# Patient Record
Sex: Female | Born: 1955 | Race: White | Hispanic: No | Marital: Single | Smoking: Never smoker
Health system: Southern US, Community
[De-identification: ages and names within clinical notes are randomized; demographics above are authoritative.]

## PROBLEM LIST (undated history)

## (undated) DIAGNOSIS — M1990 Unspecified osteoarthritis, unspecified site: Secondary | ICD-10-CM

## (undated) DIAGNOSIS — M797 Fibromyalgia: Secondary | ICD-10-CM

## (undated) DIAGNOSIS — I10 Essential (primary) hypertension: Secondary | ICD-10-CM

## (undated) DIAGNOSIS — Z79899 Other long term (current) drug therapy: Secondary | ICD-10-CM

## (undated) DIAGNOSIS — G4733 Obstructive sleep apnea (adult) (pediatric): Secondary | ICD-10-CM

## (undated) DIAGNOSIS — F419 Anxiety disorder, unspecified: Secondary | ICD-10-CM

## (undated) DIAGNOSIS — E785 Hyperlipidemia, unspecified: Secondary | ICD-10-CM

## (undated) DIAGNOSIS — K635 Polyp of colon: Secondary | ICD-10-CM

## (undated) DIAGNOSIS — G4700 Insomnia, unspecified: Secondary | ICD-10-CM

## (undated) DIAGNOSIS — Z79631 Long term (current) use of antimetabolite agent: Secondary | ICD-10-CM

## (undated) HISTORY — PX: GASTRIC BYPASS: SHX52

## (undated) HISTORY — DX: Fibromyalgia: M79.7

## (undated) HISTORY — DX: Essential (primary) hypertension: I10

## (undated) HISTORY — PX: SINUS IRRIGATION: SHX2411

## (undated) HISTORY — DX: Insomnia, unspecified: G47.00

## (undated) HISTORY — DX: Anxiety disorder, unspecified: F41.9

## (undated) HISTORY — DX: Polyp of colon: K63.5

## (undated) HISTORY — PX: REPLACEMENT TOTAL KNEE: SUR1224

## (undated) HISTORY — PX: HERNIA REPAIR: SHX51

## (undated) HISTORY — DX: Long term (current) use of antimetabolite agent: Z79.631

## (undated) HISTORY — PX: CATARACT EXTRACTION: SUR2

## (undated) HISTORY — PX: TONSILLECTOMY: SHX5217

## (undated) HISTORY — DX: Obstructive sleep apnea (adult) (pediatric): G47.33

## (undated) HISTORY — PX: COLONOSCOPY: SHX174

## (undated) HISTORY — DX: Hyperlipidemia, unspecified: E78.5

## (undated) HISTORY — DX: Other long term (current) drug therapy: Z79.899

## (undated) HISTORY — DX: Unspecified osteoarthritis, unspecified site: M19.90

## (undated) HISTORY — PX: CHOLECYSTECTOMY: SHX55

---

## 2013-08-26 DIAGNOSIS — M797 Fibromyalgia: Secondary | ICD-10-CM | POA: Insufficient documentation

## 2013-11-09 DIAGNOSIS — Z789 Other specified health status: Secondary | ICD-10-CM | POA: Insufficient documentation

## 2013-11-09 DIAGNOSIS — Z531 Procedure and treatment not carried out because of patient's decision for reasons of belief and group pressure: Secondary | ICD-10-CM | POA: Insufficient documentation

## 2013-12-22 DIAGNOSIS — R040 Epistaxis: Secondary | ICD-10-CM | POA: Insufficient documentation

## 2014-06-02 DIAGNOSIS — H269 Unspecified cataract: Secondary | ICD-10-CM | POA: Insufficient documentation

## 2015-11-07 DIAGNOSIS — R1310 Dysphagia, unspecified: Secondary | ICD-10-CM | POA: Insufficient documentation

## 2015-11-21 DIAGNOSIS — Z1211 Encounter for screening for malignant neoplasm of colon: Secondary | ICD-10-CM | POA: Insufficient documentation

## 2015-12-06 DIAGNOSIS — F3340 Major depressive disorder, recurrent, in remission, unspecified: Secondary | ICD-10-CM | POA: Insufficient documentation

## 2015-12-06 DIAGNOSIS — K922 Gastrointestinal hemorrhage, unspecified: Secondary | ICD-10-CM | POA: Insufficient documentation

## 2016-01-22 DIAGNOSIS — H6192 Disorder of left external ear, unspecified: Secondary | ICD-10-CM | POA: Insufficient documentation

## 2017-11-13 LAB — HM PAP SMEAR

## 2019-01-14 LAB — HM COLONOSCOPY

## 2019-12-13 LAB — HM DEXA SCAN

## 2020-01-10 LAB — HM MAMMOGRAPHY

## 2020-08-07 ENCOUNTER — Other Ambulatory Visit: Payer: Self-pay | Admitting: Internal Medicine

## 2020-08-07 ENCOUNTER — Telehealth: Payer: Self-pay | Admitting: Family Medicine

## 2020-08-07 DIAGNOSIS — M2550 Pain in unspecified joint: Secondary | ICD-10-CM

## 2020-08-07 NOTE — Telephone Encounter (Signed)
New pt has appt May 10th.  She wants to know if she can have her labs drawn here this week for her Rheumotologist so that they will refill her medication injectible Methotrexate.  If you agree, she will have the order faxed over.  Please advise [phone]

## 2020-08-07 NOTE — Telephone Encounter (Signed)
Left another message for pt to call back to schedule a lab appointment and to bring orders with her

## 2020-08-07 NOTE — Addendum Note (Signed)
Addended by: Herminio Commons A on: 08/07/2020 04:31 PM   Modules accepted: Orders

## 2020-08-07 NOTE — Telephone Encounter (Signed)
As long as her rheumatologist puts in the lab orders so that the results can be sent to them I am fine with this.

## 2020-08-07 NOTE — Telephone Encounter (Signed)
Left message for pt to call back to schedule appt for labs

## 2020-08-10 ENCOUNTER — Other Ambulatory Visit: Payer: Medicare Other

## 2020-08-10 ENCOUNTER — Encounter: Payer: Self-pay | Admitting: Orthopedic Surgery

## 2020-08-10 ENCOUNTER — Other Ambulatory Visit: Payer: Self-pay

## 2020-08-10 DIAGNOSIS — M2550 Pain in unspecified joint: Secondary | ICD-10-CM

## 2020-08-11 LAB — CBC WITH DIFFERENTIAL/PLATELET
Basophils Absolute: 0 10*3/uL (ref 0.0–0.2)
Basos: 1 %
EOS (ABSOLUTE): 0.1 10*3/uL (ref 0.0–0.4)
Eos: 2 %
Hematocrit: 38.2 % (ref 34.0–46.6)
Hemoglobin: 12.4 g/dL (ref 11.1–15.9)
Immature Grans (Abs): 0 10*3/uL (ref 0.0–0.1)
Immature Granulocytes: 0 %
Lymphocytes Absolute: 1.4 10*3/uL (ref 0.7–3.1)
Lymphs: 40 %
MCH: 31.6 pg (ref 26.6–33.0)
MCHC: 32.5 g/dL (ref 31.5–35.7)
MCV: 97 fL (ref 79–97)
Monocytes Absolute: 0.4 10*3/uL (ref 0.1–0.9)
Monocytes: 11 %
Neutrophils Absolute: 1.5 10*3/uL (ref 1.4–7.0)
Neutrophils: 46 %
Platelets: 257 10*3/uL (ref 150–450)
RBC: 3.92 x10E6/uL (ref 3.77–5.28)
RDW: 13.5 % (ref 11.7–15.4)
WBC: 3.4 10*3/uL (ref 3.4–10.8)

## 2020-08-11 LAB — URINALYSIS
Bilirubin, UA: NEGATIVE
Glucose, UA: NEGATIVE
Leukocytes,UA: NEGATIVE
Nitrite, UA: NEGATIVE
RBC, UA: NEGATIVE
Specific Gravity, UA: >=1.03 — AB (ref 1.005–1.030)
Urobilinogen, Ur: 1 mg/dL (ref 0.2–1.0)
pH, UA: 5.5 (ref 5.0–7.5)

## 2020-08-11 LAB — COMPREHENSIVE METABOLIC PANEL
ALT: 13 IU/L (ref 0–32)
AST: 15 IU/L (ref 0–40)
Albumin/Globulin Ratio: 2 (ref 1.2–2.2)
Albumin: 4.3 g/dL (ref 3.8–4.8)
Alkaline Phosphatase: 135 IU/L — ABNORMAL HIGH (ref 44–121)
BUN/Creatinine Ratio: 14 (ref 12–28)
BUN: 11 mg/dL (ref 8–27)
Bilirubin Total: 0.4 mg/dL (ref 0.0–1.2)
CO2: 27 mmol/L (ref 20–29)
Calcium: 9.7 mg/dL (ref 8.7–10.3)
Chloride: 101 mmol/L (ref 96–106)
Creatinine, Ser: 0.78 mg/dL (ref 0.57–1.00)
Globulin, Total: 2.2 g/dL (ref 1.5–4.5)
Glucose: 97 mg/dL (ref 65–99)
Potassium: 3.6 mmol/L (ref 3.5–5.2)
Sodium: 141 mmol/L (ref 134–144)
Total Protein: 6.5 g/dL (ref 6.0–8.5)
eGFR: 84 mL/min/{1.73_m2} (ref >59)

## 2020-08-11 LAB — C-REACTIVE PROTEIN: CRP: <1 mg/L (ref 0–10)

## 2020-08-11 LAB — SEDIMENTATION RATE: Sed Rate: 3 mm/hr (ref 0–40)

## 2020-08-11 NOTE — Progress Notes (Signed)
Please forward the results to the the ordering provider. She is not yet my patient and we did this to help her with her orthopedic issues that are being manage by a different provider.

## 2020-08-16 ENCOUNTER — Other Ambulatory Visit: Payer: Self-pay

## 2020-08-16 ENCOUNTER — Ambulatory Visit: Payer: Self-pay

## 2020-08-16 ENCOUNTER — Encounter: Payer: Self-pay | Admitting: Orthopedic Surgery

## 2020-08-16 ENCOUNTER — Ambulatory Visit (INDEPENDENT_AMBULATORY_CARE_PROVIDER_SITE_OTHER): Payer: Medicare Other | Admitting: Orthopedic Surgery

## 2020-08-16 DIAGNOSIS — M79672 Pain in left foot: Secondary | ICD-10-CM | POA: Diagnosis not present

## 2020-08-16 NOTE — Progress Notes (Signed)
Office Visit Note   Patient: Diana [name]           Date of Birth: 23-Jul-1955           MRN: [id number] Visit Date: 08/16/2020 Requested by: No referring provider defined for this encounter. PCP: Avanell Shackleton, NP-C  Subjective: Chief Complaint  Patient presents with  . Left Foot - Pain    HPI: Patient presents for evaluation of left foot pain.  She twisted her ankle and foot about a month ago.  Describes swelling and bruising as well as feeling pressure in that left foot.  Just recently moved here from [place] in early April.  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  She does have a known history of osteoporosis and also takes calcium and vitamin D.  Taking Tylenol for pain.  Has a history of gastric bypass and therefore cannot take anti-inflammatories as much.              ROS: All systems reviewed are negative as they relate to the chief complaint within the history of present illness.  Patient denies  fevers or chills.   Assessment & Plan: Visit Diagnoses:  1. Pain in left foot     Plan: Impression is metatarsal shaft fractures left foot with mild swelling still present.  The third metatarsal shaft fracture is still tender.  She has been walking in shoes that are not particularly stiff.  Plan at this time is activity as tolerated without going barefoot.  Stiffer soled shoes encouraged.  Come back in 4 weeks for clinical recheck and repeat radiographs just to make sure we have some callus formation around that metatarsal shaft #3.  Follow-Up Instructions: Return in about 4 weeks (around 09/13/2020).   Orders:  Orders Placed This Encounter  Procedures  . XR Foot Complete Left   No orders of the defined types were placed in this encounter.     Procedures: No procedures performed   Clinical Data: No additional findings.  Objective: Vital Signs: There were no vitals taken for this visit.  Physical Exam:   Constitutional: Patient appears  well-developed HEENT:  Head: Normocephalic Eyes:EOM are normal Neck: Normal range of motion Cardiovascular: Normal rate Pulmonary/chest: Effort normal Neurologic: Patient is alert Skin: Skin is warm Psychiatric: Patient has normal mood and affect    Ortho Exam: Ortho exam demonstrates perfused foot on the left.  There is some swelling but compartments are soft.  Lesser toe deformities are present which are mild on the left-hand side.  Patient has intact anterior to posterior to peroneal and Achilles function.  No pain in the ankle with tibiotalar subtalar transverse tarsal range of motion.  There is a little bit of pain with palpation of the second and third metatarsal shafts.  Specialty Comments:  No specialty comments available.  Imaging: XR Foot Complete Left  Result Date: 08/16/2020 AP lateral oblique left foot radiographs reviewed.  Distal metatarsal shaft fracture with mild displacement present in metatarsal shaft #2 and 3.  Callus formation is present along the medial aspect of the second metatarsal shaft fracture.  Not much callus formation is present on the third metatarsal shaft fracture.  No tarsometatarsal malalignment present.    PMFS History: There are no problems to display for this patient.  No past medical history on file.  No family history on file.   Social History   Occupational History  . Not on file  Tobacco Use  . Smoking status:  Not on file  . Smokeless tobacco: Not on file  Substance and Sexual Activity  . Alcohol use: Not on file  . Drug use: Not on file  . Sexual activity: Not on file

## 2020-08-22 ENCOUNTER — Encounter: Payer: Self-pay | Admitting: Family Medicine

## 2020-08-22 ENCOUNTER — Ambulatory Visit (INDEPENDENT_AMBULATORY_CARE_PROVIDER_SITE_OTHER): Payer: Medicare Other | Admitting: Family Medicine

## 2020-08-22 ENCOUNTER — Other Ambulatory Visit: Payer: Self-pay

## 2020-08-22 VITALS — BP 120/70 | HR 80 | Ht 61.5 in | Wt 145.2 lb

## 2020-08-22 DIAGNOSIS — K219 Gastro-esophageal reflux disease without esophagitis: Secondary | ICD-10-CM | POA: Diagnosis not present

## 2020-08-22 DIAGNOSIS — Z9884 Bariatric surgery status: Secondary | ICD-10-CM

## 2020-08-22 DIAGNOSIS — Z79631 Long term (current) use of antimetabolite agent: Secondary | ICD-10-CM | POA: Insufficient documentation

## 2020-08-22 DIAGNOSIS — F419 Anxiety disorder, unspecified: Secondary | ICD-10-CM | POA: Insufficient documentation

## 2020-08-22 DIAGNOSIS — R110 Nausea: Secondary | ICD-10-CM

## 2020-08-22 DIAGNOSIS — G4733 Obstructive sleep apnea (adult) (pediatric): Secondary | ICD-10-CM | POA: Insufficient documentation

## 2020-08-22 DIAGNOSIS — M1990 Unspecified osteoarthritis, unspecified site: Secondary | ICD-10-CM | POA: Insufficient documentation

## 2020-08-22 DIAGNOSIS — E785 Hyperlipidemia, unspecified: Secondary | ICD-10-CM | POA: Insufficient documentation

## 2020-08-22 DIAGNOSIS — I10 Essential (primary) hypertension: Secondary | ICD-10-CM | POA: Insufficient documentation

## 2020-08-22 DIAGNOSIS — Z9989 Dependence on other enabling machines and devices: Secondary | ICD-10-CM

## 2020-08-22 DIAGNOSIS — Z79899 Other long term (current) drug therapy: Secondary | ICD-10-CM

## 2020-08-22 DIAGNOSIS — Z9889 Other specified postprocedural states: Secondary | ICD-10-CM

## 2020-08-22 NOTE — Progress Notes (Signed)
Subjective:    Patient ID: Diana [name], female    DOB: July 08, 1955, 65 y.o.   MRN: [id number]  HPI Chief Complaint  Patient presents with  . new pt     New pt get established.    She is new to the practice and here to establish care.  Previous medical care: moved here from [place] last month.   I do not have any recent medical records on her including from her PCP, GI, psychiatrist, or rheumatologist.  HTN-reports taking her medication daily without any concerns.  HL- on statin and no side effects  She is being treated for RA and osteoarthritis  Has a televisit scheduled with her rheumatologist at her previous location but states she needs a new rheumatologist here.  Requesting GI and Rheumatologist referrals   States she plans to schedule with a psychiatrist soon.  She needs to establish with 1 locally. Anxiety - taking Ativan 2-3 times per day and this has been prescribed by her psychiatrist. Taking Seroquel for insomnia.  OSA- on CPAP. Diagnosed 15 years ago    Nausea - complains of a 4 month history of this. She was prescribed Zofran, Protonix by Dr. Aline August her rheumatologist back in [place].  States she also takes Tums  Nausea occurs 3-4 times per week but rarely vomits  No unexplained weight loss.   Denies fever, chills, night sweats, dizziness, chest pain, palpitations, shortness of breath, abdominal pain, diarrhea or constipation, urinary symptoms or LE edema.  Hx of gastric bypass in 2009.    Other providers: Rheumatologist- Dr. Leanna Sato, [place]  Orthopedist- Dr. August Saucer  GI in [facility] Psychiatrist in [facility]    Reviewed allergies, medications, past medical, surgical, family, and social history.    Review of Systems Pertinent positives and negatives in the history of present illness.     Objective:   Physical Exam BP 120/70   Pulse 80   Ht 5' 1.5" (1.562 m)   Wt 145 lb 3.2 oz (65.9 kg)   SpO2 98%   BMI 26.99 kg/m   Alert  and in no distress. Cardiac exam shows a regular sinus rhythm without murmurs or gallops. Lungs are clear to auscultation.  Abdomen is soft, nondistended, nontender, normal bowel sounds, no palpable masses, no rebound or guarding.       Assessment & Plan:  Gastroesophageal reflux disease, unspecified whether esophagitis present - Plan: Ambulatory referral to Gastroenterology -Referral to GI for further evaluation.  No red flag symptoms today.  Chronic nausea - Plan: Ambulatory referral to Gastroenterology -May be related to GERD.  Abdominal exam is benign.  Referral to GI for further evaluation  History of gastric bypass - Plan: Ambulatory referral to Gastroenterology  Primary hypertension -Continue current medication.  Blood pressure well controlled.  Hyperlipidemia, unspecified hyperlipidemia type -Continue on statin.  Osteoarthritis, unspecified osteoarthritis type, unspecified site -She has establish care with Dr. August Saucer.  Discussed that since she does not have a diagnosis of RA and I do not have the notes from her rheumatologist in [facility], I recommend consulting with Dr. August Saucer regarding her referral to rheumatology.  Methotrexate, long term, current use -States she is being treated for RA even though she has not been officially diagnosed.  I will request records from her rheumatologist.  History of breast biopsy - Plan: Ambulatory referral to Gynecology -She would like a referral to gynecology.  History of breast biopsy.  OSA on CPAP -She is doing well with her CPAP and will continue using  it.  Anxiety -Denies any refills today.  Discussed that I recommend she establish care with a local psychiatrist which is her plan as well.  Discussed that I recommend that they take over her medications for her anxiety since it is a controlled substance.  I will await medical records and see her back as needed.

## 2020-08-22 NOTE — Patient Instructions (Signed)
You will hear from  GI and the gynecologist     You can call to schedule your appointment with the psychiatrist/counselor. A few offices are listed below for you to call.    [facility]  Ask for a psychiatrist  [address]  (across from [facility])  [phone]      [facility] [address] [place], [place] [address] [phone]   [facility]  [address], [place], [place] [address]  Phone: [phone]   [facility] [address] [place], [place] [address]  Phone: [phone]

## 2020-09-01 ENCOUNTER — Telehealth: Payer: Self-pay | Admitting: Family Medicine

## 2020-09-01 NOTE — Telephone Encounter (Signed)
Received requested records from [facility].

## 2020-09-04 ENCOUNTER — Encounter: Payer: Self-pay | Admitting: Orthopedic Surgery

## 2020-09-04 NOTE — Telephone Encounter (Signed)
Should be good for reg appt

## 2020-09-05 ENCOUNTER — Encounter: Payer: Self-pay | Admitting: Internal Medicine

## 2020-09-12 ENCOUNTER — Telehealth: Payer: Self-pay

## 2020-09-12 NOTE — Telephone Encounter (Signed)
Please advise if you got the RA notes from previous RA doctor or if ok to referral to RA. If so what for?

## 2020-09-12 NOTE — Telephone Encounter (Signed)
pT. Called stating that she used to see rheumatology where she used to live at for RA. She stated that the notes from her old rheumatologist was faxed here and she was told that Vickie had the notes for review. She would like a referral to a new rheumatologist here in [place].

## 2020-09-12 NOTE — Telephone Encounter (Signed)
I only see obgyn from patient not RA. Please advise

## 2020-09-13 ENCOUNTER — Other Ambulatory Visit: Payer: Self-pay

## 2020-09-13 ENCOUNTER — Ambulatory Visit (INDEPENDENT_AMBULATORY_CARE_PROVIDER_SITE_OTHER): Payer: Medicare Other | Admitting: Orthopedic Surgery

## 2020-09-13 ENCOUNTER — Ambulatory Visit (INDEPENDENT_AMBULATORY_CARE_PROVIDER_SITE_OTHER): Payer: Medicare Other

## 2020-09-13 DIAGNOSIS — M79672 Pain in left foot: Secondary | ICD-10-CM

## 2020-09-13 DIAGNOSIS — M0600 Rheumatoid arthritis without rheumatoid factor, unspecified site: Secondary | ICD-10-CM

## 2020-09-13 NOTE — Telephone Encounter (Signed)
Pt will call and have RA records sent over but will ask ortho about referral for RA

## 2020-09-13 NOTE — Telephone Encounter (Signed)
Left message for pt to call back  °

## 2020-09-13 NOTE — Telephone Encounter (Signed)
Let her know that I have not received the records from her rheumatologist. She may want to ask her orthopedist for the referral to rheumatologist since she has been seeing an orthopedist here. Let me know.

## 2020-09-17 ENCOUNTER — Encounter: Payer: Self-pay | Admitting: Orthopedic Surgery

## 2020-09-17 NOTE — Progress Notes (Signed)
   Post-fracture visit Note   Patient: Diana [name]           Date of Birth: Dec 13, 1955           MRN: [id number] Visit Date: 09/13/2020 PCP: Avanell Shackleton, NP-C   Assessment & Plan:  Chief Complaint:  Chief Complaint  Patient presents with  . Left Foot - Follow-up, Fracture   Visit Diagnoses:  1. Pain in left foot     Plan: Patient is a 65 year old female who returns for evaluation of metatarsal shaft fractures of the left foot.  She reports the injury was about 6 weeks ago.  She has not made any significant progress in regards to her pain.  She is taking extra strength Tylenol for pain and using Ace bandage and elevating the leg to help with pain.  She has difficulty with ambulation due to pain.  She does have history of seronegative rheumatoid arthritis for which she takes methotrexate.  She would like referral to rheumatology to establish a local rheumatologist now that she has moved to the area.  Radiographs of the metatarsals were taken today and do show progression of callus formation compared with last set of radiographs.  She is currently ambulating in moccasins with very little support.  She has continued tenderness over the fracture sites but there is no micromotion that is discernible.  Plan to refer patient to rheumatology.  Patient was placed in a fracture shoe/postop shoe with follow-up in 4 weeks for clinical recheck.  If she has no improvement in symptoms at that time, consider CT scan of the left foot to evaluate for nonunion with potential bone stimulator to follow if nonunion is confirmed on scan.  Patient agreed with this plan and understands.  Follow-up in 4 weeks.  Follow-Up Instructions: No follow-ups on file.   Orders:  Orders Placed This Encounter  Procedures  . XR Foot Complete Left   No orders of the defined types were placed in this encounter.   Imaging: No results found.  PMFS History: Patient Active Problem List   Diagnosis Date Noted  .  History of breast biopsy 08/22/2020  . Hypertension   . Hyperlipidemia   . Osteoarthritis   . Anxiety   . Methotrexate, long term, current use   . OSA on CPAP    Past Medical History:  Diagnosis Date  . Anxiety   . Hyperlipidemia   . Hypertension   . Methotrexate, long term, current use   . OSA on CPAP   . Osteoarthritis     Family History  Problem Relation Age of Onset  . Hypertension Mother   . AAA (abdominal aortic aneurysm) Mother     Past Surgical History:  Procedure Laterality Date  . GASTRIC BYPASS    . REPLACEMENT TOTAL KNEE Right    Social History   Occupational History  . Not on file  Tobacco Use  . Smoking status: Never Smoker  . Smokeless tobacco: Never Used  Substance and Sexual Activity  . Alcohol use: Never  . Drug use: Never  . Sexual activity: Not Currently

## 2020-09-25 ENCOUNTER — Telehealth: Payer: Self-pay | Admitting: Gastroenterology

## 2020-09-25 NOTE — Telephone Encounter (Signed)
Hi Dr. Orvan Falconer,  I received operative report dated 12/24/2018.   Operative report states On Antegrade and retrograde viewing entire colon, the patient was found to have a normal colonoscopy.  I will be sending records for your review..  Thanks

## 2020-09-25 NOTE — Telephone Encounter (Signed)
Dr. Orvan Falconer,   To date pathology results are requested.   Thank Golden Pop

## 2020-09-25 NOTE — Telephone Encounter (Signed)
Hi Dr. Orvan Falconer,  We have received a referral from patient's PCP for gastroesophageal reflux disease, unspecified whether esophagitis present, Chronic nausea. History of gastric bypass.    Patient is requesting you for GI treatment/provider.  Patient had colonoscopy 2020; Dr. Burnard Bunting; Records are in Epic..   Can you please review  records and advise on scheduling?  Thank you.

## 2020-09-25 NOTE — Telephone Encounter (Signed)
Awaiting those records for review.  

## 2020-09-28 NOTE — Telephone Encounter (Signed)
Reviewed 41 pages of outside records from primary care provider.  Patient referred for reflux, chronic nausea, and history of gastric bypass.  She has a history of hypercholesterolemia, posttraumatic stress disorder, anxiety, depression, hypertension, diverticulitis, arthritis, cervical disc disease with chronic low back pain, fibromyalgia, obstructive sleep apnea on CPAP, and headaches.  She has allergies to Cymbalta, Lyrica, and caffeine.  EGD and colonoscopy performed by Dr. Michaela Corner in [place] 12/24/2018 for abdominal pain and history of colon polyps.  Surgeon Notes normal postoperative anatomy.  There were no polyps.  However, he notes that the colon is extremely tortuous.  Graphs of landmarks are included in the procedure note.  Records mention a colonoscopy 2017 when polyps were removed.  Neither the procedure report nor the pathology results are available.  CT of the abdomen and pelvis with contrast 12/16/2018 to evaluate abdominal pain and distention showed mild to moderate thickening in the distal transverse, descending, and sigmoid colon of unclear etiology.  Normal post surgical changes following bariatric surgery and cholecystectomy.  CT of the abdomen and pelvis with oral and IV contrast 12/08/2019 to evaluate abdominal pain showed normal postsurgical changes.  No cause for symptoms.

## 2020-09-28 NOTE — Telephone Encounter (Signed)
Thank you Dr. Orvan Falconer,  Patient scheduled for new patient 11/07/20 at 3:20pm..

## 2020-09-29 ENCOUNTER — Encounter: Payer: Medicare Other | Admitting: Obstetrics & Gynecology

## 2020-09-29 ENCOUNTER — Telehealth: Payer: Self-pay | Admitting: Family Medicine

## 2020-09-29 MED ORDER — PANTOPRAZOLE SODIUM 20 MG PO TBEC: 20.0000 mg | DELAYED_RELEASE_TABLET | Freq: Every day | ORAL | 2 refills | Status: DC

## 2020-09-29 MED ORDER — LOSARTAN POTASSIUM 50 MG PO TABS: 1.0000 | ORAL_TABLET | Freq: Every day | ORAL | 2 refills | Status: DC

## 2020-09-29 NOTE — Telephone Encounter (Signed)
Sent in meds 

## 2020-09-29 NOTE — Telephone Encounter (Signed)
Pt called and is requesting a refill on her losartan and her pantoprazole Please send to the [id number] - [place], [place] - [address] AT [facility] [address] & [address]

## 2020-10-02 ENCOUNTER — Encounter: Payer: Medicare Other | Admitting: Obstetrics & Gynecology

## 2020-10-04 ENCOUNTER — Telehealth: Payer: Self-pay | Admitting: Family Medicine

## 2020-10-04 NOTE — Telephone Encounter (Signed)
Received requested records from [facility]

## 2020-10-09 ENCOUNTER — Encounter: Payer: Self-pay | Admitting: Family Medicine

## 2020-10-10 ENCOUNTER — Telehealth: Payer: Self-pay | Admitting: Internal Medicine

## 2020-10-10 NOTE — Telephone Encounter (Signed)
Pt called and would like to know if losartan and methotrexate was the same medications and should she be taking them. I advised pt that losartan is bp and methrotrexate is for her RA issues. Pt is having some dizzy spells and she has an appt with Dr. August Saucer tomorrow so she will ask him about this and then I advised her to ask if he can refill methotrexate since he is the one referring her to rhuematology. Advised pt is dizziness kept going to schedule an appointment here with a provider

## 2020-10-11 ENCOUNTER — Telehealth: Payer: Self-pay

## 2020-10-11 ENCOUNTER — Ambulatory Visit (INDEPENDENT_AMBULATORY_CARE_PROVIDER_SITE_OTHER): Payer: Medicare Other | Admitting: Orthopedic Surgery

## 2020-10-11 DIAGNOSIS — M79672 Pain in left foot: Secondary | ICD-10-CM

## 2020-10-11 NOTE — Telephone Encounter (Signed)
Patient seen in office today. Stated that her PCP had old records from previous rheumatology clinic she was treated at and entered the records into Epic.

## 2020-10-12 ENCOUNTER — Encounter: Payer: Self-pay | Admitting: Internal Medicine

## 2020-10-14 ENCOUNTER — Encounter: Payer: Self-pay | Admitting: Orthopedic Surgery

## 2020-10-14 NOTE — Progress Notes (Signed)
   Post-Op Visit Note   Patient: Diana [name]           Date of Birth: 06-Mar-1956           MRN: [id number] Visit Date: 10/11/2020 PCP: Avanell Shackleton, NP-C   Assessment & Plan:  Chief Complaint:  Chief Complaint  Patient presents with   Left Foot - Follow-up, Fracture   Visit Diagnoses:  1. Pain in left foot     Plan: Diana [name] is a patient with left foot metatarsal fracture sustained several months ago.  Here for clinical recheck and decision for or against CT scan for nonunion.  She has been ambulating with regular shoe.  She fell once last 2 weeks ago.  They have issues of vertigo and nausea.  Has GI appointment next week.  On examination of the right knee she has range of motion 10-1 20.  Left foot is examined.  She does have some tenderness and slight swelling around the metatarsal heads with a little bit of crepitus palpable.  Plan at this time is left foot CT scan to assess for nonunion of these metatarsal neck fractures.  Continue with regular shoewear and follow-up after that study.  Follow-Up Instructions: Return for after MRI.   Orders:  Orders Placed This Encounter  Procedures   CT FOOT LEFT WO CONTRAST   No orders of the defined types were placed in this encounter.   Imaging: No results found.  PMFS History: Patient Active Problem List   Diagnosis Date Noted   History of breast biopsy 08/22/2020   Hypertension    Hyperlipidemia    Osteoarthritis    Anxiety    Methotrexate, long term, current use    OSA on CPAP    Past Medical History:  Diagnosis Date   Anxiety    Hyperlipidemia    Hypertension    Methotrexate, long term, current use    OSA on CPAP    Osteoarthritis     Family History  Problem Relation Age of Onset   Hypertension Mother    AAA (abdominal aortic aneurysm) Mother     Past Surgical History:  Procedure Laterality Date   GASTRIC BYPASS     REPLACEMENT TOTAL KNEE Right    Social History   Occupational History   Not on  file  Tobacco Use   Smoking status: Never   Smokeless tobacco: Never  Substance and Sexual Activity   Alcohol use: Never   Drug use: Never   Sexual activity: Not Currently

## 2020-10-17 ENCOUNTER — Encounter: Payer: Self-pay | Admitting: Family Medicine

## 2020-10-17 ENCOUNTER — Telehealth (INDEPENDENT_AMBULATORY_CARE_PROVIDER_SITE_OTHER): Payer: Medicare Other | Admitting: Family Medicine

## 2020-10-17 ENCOUNTER — Other Ambulatory Visit: Payer: Self-pay

## 2020-10-17 VITALS — Ht 61.5 in | Wt 132.0 lb

## 2020-10-17 DIAGNOSIS — K219 Gastro-esophageal reflux disease without esophagitis: Secondary | ICD-10-CM | POA: Diagnosis not present

## 2020-10-17 DIAGNOSIS — J019 Acute sinusitis, unspecified: Secondary | ICD-10-CM

## 2020-10-17 DIAGNOSIS — J309 Allergic rhinitis, unspecified: Secondary | ICD-10-CM | POA: Diagnosis not present

## 2020-10-17 MED ORDER — AMOXICILLIN 875 MG PO TABS: 875.0000 mg | ORAL_TABLET | Freq: Two times a day (BID) | ORAL | 0 refills | Status: DC

## 2020-10-17 MED ORDER — IPRATROPIUM BROMIDE 0.03 % NA SOLN: 2.0000 | Freq: Two times a day (BID) | NASAL | 5 refills | Status: AC

## 2020-10-17 NOTE — Progress Notes (Signed)
   Subjective:    Patient ID: Diana [name], female    DOB: 07-Mar-1956, 65 y.o.   MRN: [id number]  HPI Documentation for virtual audio and video telecommunications through Caregility encounter: The patient was located at home. 2 patient identifiers used.  The provider was located in the office. The patient did consent to this visit and is aware of possible charges through their insurance for this visit. The other persons participating in this telemedicine service were none. Time spent on call was 10 minutes and in review of previous records >20 minutes total for counseling and coordination of care. This virtual service is not related to other E/M service within previous 7 days.  She complains of a 5-day history of slight sore throat, postnasal drainage, left upper tooth discomfort with a previous history of sinus infections.  She also has underlying allergies and would like a refill on her Atrovent.  She states that she has been having difficulty for the last month or so with nausea and weight loss.  She is trying to get an appointment with her gastroenterologist.  Review of Systems     Objective:   Physical Exam Alert and in no distress.  Left arm but does appear slightly erythematous.       Assessment & Plan:  Acute sinusitis, recurrence not specified, unspecified location - Plan: amoxicillin (AMOXIL) 875 MG tablet  Gastroesophageal reflux disease, unspecified whether esophagitis present  Allergic rhinitis, unspecified seasonality, unspecified trigger - Plan: ipratropium (ATROVENT) 0.03 % nasal spray I will treat her sinus infection with Amoxil.  She is to call if she has any questions.  Atrovent was renewed.  Recommended if she cannot get in with her gastroenterologist, make an appointment with [place] and we will do further evaluation based on that.

## 2020-10-18 ENCOUNTER — Telehealth: Payer: Self-pay | Admitting: Medical

## 2020-10-18 NOTE — Telephone Encounter (Signed)
We received a CD containing imaging.  We do not have any means of downloading CD information.  I am not sure if Diana [name] requested specific imaging.  If so we can do records release request for imaging

## 2020-10-19 NOTE — Telephone Encounter (Signed)
Pt was notified. She thinks that the xrays are her knee. She will call and ask for written notes to be sent over. She asked that I shred cd as she doesn't need it

## 2020-10-23 ENCOUNTER — Other Ambulatory Visit: Payer: Self-pay | Admitting: Medical

## 2020-10-23 ENCOUNTER — Telehealth: Payer: Self-pay | Admitting: Family Medicine

## 2020-10-23 MED ORDER — BENZONATATE 200 MG PO CAPS: 200.0000 mg | ORAL_CAPSULE | Freq: Three times a day (TID) | ORAL | 0 refills | Status: DC | PRN

## 2020-10-23 NOTE — Telephone Encounter (Signed)
Pt was notified of results

## 2020-10-23 NOTE — Telephone Encounter (Signed)
Pt called and states that since her virtual with JCL she continues on the medication. She is not feeling a lot better and would like something sent in for her cough, Please send to [facility]. Pt can be reached at [phone]

## 2020-10-27 ENCOUNTER — Other Ambulatory Visit: Payer: Self-pay

## 2020-10-27 ENCOUNTER — Encounter: Payer: Self-pay | Admitting: Obstetrics & Gynecology

## 2020-10-27 ENCOUNTER — Other Ambulatory Visit (HOSPITAL_COMMUNITY)
Admission: RE | Admit: 2020-10-27 | Discharge: 2020-10-27 | Disposition: A | Discharge status: Home / Self Care | Payer: Medicare Other | Source: Ambulatory Visit | Attending: Obstetrics & Gynecology | Admitting: Obstetrics & Gynecology

## 2020-10-27 ENCOUNTER — Ambulatory Visit (INDEPENDENT_AMBULATORY_CARE_PROVIDER_SITE_OTHER): Payer: Medicare Other | Admitting: Obstetrics & Gynecology

## 2020-10-27 VITALS — BP 140/88 | HR 90 | Resp 20 | Ht 61.0 in | Wt 134.6 lb

## 2020-10-27 DIAGNOSIS — Z01419 Encounter for gynecological examination (general) (routine) without abnormal findings: Secondary | ICD-10-CM

## 2020-10-27 DIAGNOSIS — Z780 Asymptomatic menopausal state: Secondary | ICD-10-CM | POA: Diagnosis not present

## 2020-10-27 DIAGNOSIS — M810 Age-related osteoporosis without current pathological fracture: Secondary | ICD-10-CM

## 2020-10-27 DIAGNOSIS — Z1151 Encounter for screening for human papillomavirus (HPV): Secondary | ICD-10-CM | POA: Insufficient documentation

## 2020-10-27 NOTE — Progress Notes (Signed)
Diana [name] 03-23-1956 [id number]   History:    64 y.o. G3P2A1L2  RP:  New patient presenting for annual gyn exam   HPI: Postmenopause, well on no HRT.  No PMB.  No pelvic pain.  Breasts normal.  Screening mammo/Lt Dx mammo/Lt Breast Bx Benign 12/2019.  Colono 2020.  BMI 25.43.  Health Labs with [facility] NP.  BD 11/2019 Osteoporosis.     Past medical history,surgical history, family history and social history were all reviewed and documented in the EPIC chart.  Gynecologic History No LMP recorded.  Obstetric History OB History  Gravida Para Term Preterm AB Living  3 2     1     SAB IAB Ectopic Multiple Live Births  1            # Outcome Date GA Lbr Len/2nd Weight Sex Delivery Anes PTL Lv  3 SAB           2 Para           1 Para              ROS: A ROS was performed and pertinent positives and negatives are included in the history.  GENERAL: No fevers or chills. HEENT: No change in vision, no earache, sore throat or sinus congestion. NECK: No pain or stiffness. CARDIOVASCULAR: No chest pain or pressure. No palpitations. PULMONARY: No shortness of breath, cough or wheeze. GASTROINTESTINAL: No abdominal pain, nausea, vomiting or diarrhea, melena or bright red blood per rectum. GENITOURINARY: No urinary frequency, urgency, hesitancy or dysuria. MUSCULOSKELETAL: No joint or muscle pain, no back pain, no recent trauma. DERMATOLOGIC: No rash, no itching, no lesions. ENDOCRINE: No polyuria, polydipsia, no heat or cold intolerance. No recent change in weight. HEMATOLOGICAL: No anemia or easy bruising or bleeding. NEUROLOGIC: No headache, seizures, numbness, tingling or weakness. PSYCHIATRIC: No depression, no loss of interest in normal activity or change in sleep pattern.     Exam:   BP 140/88 (BP Location: Right Arm)   Pulse 90   Resp 20   Ht 5\' 1"  (1.549 m)   Wt 134 lb 9.6 oz (61.1 kg)   BMI 25.43 kg/m   Body mass index is 25.43 kg/m.  General appearance : Well developed  well nourished female. No acute distress HEENT: Eyes: no retinal hemorrhage or exudates,  Neck supple, trachea midline, no carotid bruits, no thyroidmegaly Lungs: Clear to auscultation, no rhonchi or wheezes, or rib retractions  Heart: Regular rate and rhythm, no murmurs or gallops Breast:Examined in sitting and supine position were symmetrical in appearance, no palpable masses or tenderness,  no skin retraction, no nipple inversion, no nipple discharge, no skin discoloration, no axillary or supraclavicular lymphadenopathy Abdomen: no palpable masses or tenderness, no rebound or guarding Extremities: no edema or skin discoloration or tenderness  Pelvic: Vulva: Normal             Vagina: No gross lesions or discharge  Cervix: No gross lesions or discharge.  Pap/HPV HR done.  Uterus  AV, normal size, shape and consistency, non-tender and mobile  Adnexa  Without masses or tenderness  Anus: Normal   Assessment/Plan:  65 y.o. female for annual exam   1. Encounter for routine gynecological examination with Papanicolaou smear of cervix Normal gynecologic exam in menopause.  Pap test with high-risk HPV done.  Breast exam normal.  Will repeat a screening mammogram September 2022.  Colonoscopy 2020.  Health labs with family NP.  Body mass index 19.43.  Fitness and healthy nutrition to continue. - Cytology - PAP( [place])  2. Postmenopause Well on no HRT.  No postmenopausal bleeding.  3. Age-related osteoporosis without current pathological fracture  Per patient, she is on Fosamax.  Will send [place] the details about the dosage she is on.  Vitamin D supplements, calcium intake of 1.5 g/day total, regular weightbearing physical activities.  [name], 2:24 PM 10/27/2020

## 2020-10-28 ENCOUNTER — Ambulatory Visit
Admission: RE | Admit: 2020-10-28 | Discharge: 2020-10-28 | Disposition: A | Discharge status: Home / Self Care | Payer: Medicare Other | Source: Ambulatory Visit | Attending: Orthopedic Surgery | Admitting: Orthopedic Surgery

## 2020-10-28 DIAGNOSIS — M79672 Pain in left foot: Secondary | ICD-10-CM

## 2020-10-29 ENCOUNTER — Encounter: Payer: Self-pay | Admitting: Obstetrics & Gynecology

## 2020-10-30 ENCOUNTER — Telehealth: Payer: Self-pay | Admitting: Family Medicine

## 2020-10-30 MED ORDER — HYDROCHLOROTHIAZIDE 12.5 MG PO TABS: 12.5000 mg | ORAL_TABLET | Freq: Every day | ORAL | 3 refills | Status: AC

## 2020-10-30 NOTE — Telephone Encounter (Signed)
Pt is requesting refill on Hydrochlorothiazide sent to the [facility] on [address]

## 2020-10-31 LAB — CYTOLOGY - PAP
Comment: NEGATIVE
Diagnosis: NEGATIVE
High risk HPV: NEGATIVE

## 2020-10-31 NOTE — Progress Notes (Signed)
Pls set up bone stim thx

## 2020-11-01 ENCOUNTER — Telehealth: Payer: Self-pay

## 2020-11-01 NOTE — Telephone Encounter (Signed)
Opened in error

## 2020-11-07 ENCOUNTER — Ambulatory Visit (INDEPENDENT_AMBULATORY_CARE_PROVIDER_SITE_OTHER): Payer: Medicare Other | Admitting: Gastroenterology

## 2020-11-07 ENCOUNTER — Encounter: Payer: Self-pay | Admitting: Gastroenterology

## 2020-11-07 VITALS — BP 128/70 | HR 79 | Ht 61.0 in | Wt 133.6 lb

## 2020-11-07 DIAGNOSIS — R112 Nausea with vomiting, unspecified: Secondary | ICD-10-CM

## 2020-11-07 DIAGNOSIS — K219 Gastro-esophageal reflux disease without esophagitis: Secondary | ICD-10-CM

## 2020-11-07 NOTE — Patient Instructions (Addendum)
It was my pleasure to provide care to you today. Based on our discussion, I am providing you with my recommendations below:  RECOMMENDATION(S):   I am providing you with Motegrity samples today  FOLLOW UP:  I would like for you to follow up with me in 1 month. Please call the office at [phone] to schedule your appointment. Please follow up with your PCP's office about a consult with a pharmacist to discuss polypharmacy  BMI:  If you are age 67 or older, your body mass index should be between 23-30. Your There is no height or weight on file to calculate BMI. If this is out of the aforementioned range listed, please consider follow up with your Primary Care Provider.  MY CHART:  The [facility] providers would like to encourage you to use [facility] to communicate with providers for non-urgent requests or questions.  Due to long hold times on the telephone, sending your provider a message by [facility] may be a faster and more efficient way to get a response.  Please allow 48 business hours for a response.  Please remember that this is for non-urgent requests.   Thank you for trusting me with your gastrointestinal care!    Tressia Danas, MD, MPH

## 2020-11-07 NOTE — Progress Notes (Signed)
Referring Provider: Avanell Shackleton, NP-C Primary Care Physician:  Diana Shackleton, NP-C  Reason for Consultation:  reflux, chronic nausea, and history of gastric bypass   IMPRESSION:  Acute on chronic nausea and reflux developing after gastric bypass in 2009 not previously explained by EGD, colonoscopy or CT scan x 2. Associated with severe chronic constipation. May all be due to constipation that persists despite daily Miralax, prune juice, and stool softeners. Trial of Motegrity recommended to treat both constipation and reflux. If this treats her constipation but does not improve her other symptoms, will proceed with additional evaluation.  Symptoms may also be due to polypharmacy. She will reach out to PCP to discuss pharmacy consultation to try to minimize her symptoms.   Colonic thickening on CT scan 2020 not seen on CT 2021: No follow-up indicated at this time.   History of colon polyps: Tubular adenoma removed 2017. No polyps on colonoscopy with surgeon 2020. Surveillance recommended 2025 given her father's history of colon polyps and cancer.   PLAN: Motegrity 2 mg daily to treat costipation (3 weeks of samples provided) Consider UGI series if symptoms persist Discussed pharmacy consult with NP [facility] [id number] Follow-up in 1 month  I spent over 60 minutes, including in depth chart review, independent review of results, communicating results with the patient directly, face-to-face time with the patient, coordinating care, ordering studies and medications as appropriate, and documentation.     HPI: Diana [name] is a 65 y.o. [name] referred by NP [facility]. She presents 45 minutes late for her first visit today, having gotten lost finding her way to our office. She moved to the area in May.   I reviewed 82 pages of outside records from her primary care provider today and an addition 53 pages of records from outside hospitals.  She is referred for reflux, chronic nausea,  and history of gastric bypass in 2009. She has a history of hypercholesterolemia, posttraumatic stress disorder, anxiety, depression, hypertension, diverticulitis, arthritis, cervical disc disease with chronic low back pain, fibromyalgia, obstructive sleep apnea on CPAP, and headaches. Previously on methotrexate as prescribed by her rheumatologist.    Lost 140 pounds after her gastric bypass in 2009. But, notes that she wasn't well during that time with emotional distress and frequent nausea/vomiting.  Recently experiencing acute on chronic nausea, dry heaves, and vomiting. Symptoms have been present for several years but recently worsened. Associated constipation. Uses prune juice, Dulcolax, previously on Miralax, and suppositories to have a bowel movement every 3-4 days. Worse over the last month. Nausea improves after a large bowel movement. Medications control the nausea.  No nocturnal symptoms.   Has lost 20 pounds in the last few months. Attributes this to stress since moving in May. 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 She finds her anxiety has been very bad since she moved. She wishes to return to [facility].    Records mention a colonoscopy 2017 when she was when polyps were removed.  Pathology results for a cecal polyp showed tubular adenoma. The procedure report was not included. She was hospitalized for post-polypectomy bleeding treated with clip.   EGD and colonoscopy performed by Dr. Michaela [name] in [place] 12/24/2018 for abdominal pain and history of colon polyps.  Surgeon Notes normal postoperative anatomy.  There were no polyps.  However, he notes that the colon is extremely tortuous.  Graphs of landmarks are included in the procedure note.   CT of the abdomen and pelvis with  contrast 12/16/2018 to evaluate abdominal pain and distention showed mild to moderate thickening in the distal transverse, descending, and sigmoid colon of unclear etiology.  Normal post surgical  changes following bariatric surgery and cholecystectomy.   CT of the abdomen and pelvis with oral and IV contrast 12/08/2019 to evaluate abdominal pain showed normal postsurgical changes.  No cause for symptoms.  Father with colon polyps and colon cancer. No other known family history of colon cancer or polyps. No family history of uterine/endometrial cancer, pancreatic cancer or gastric/stomach cancer.   Past Medical History:  Diagnosis Date   Anxiety    Colon polyp    Hyperlipidemia    Hypertension    Methotrexate, long term, current use    OSA on CPAP    Osteoarthritis     Past Surgical History:  Procedure Laterality Date   COLONOSCOPY     GASTRIC BYPASS     REPLACEMENT TOTAL KNEE Right     Current Outpatient Medications  Medication Sig Dispense Refill   acetaminophen (TYLENOL) 500 MG tablet Take 500 mg by mouth every 6 (six) hours as needed.     atorvastatin (LIPITOR) 10 MG tablet Take 1 tablet by mouth at bedtime.     B Complex Vitamins (B COMPLEX 100 PO) Take 1 tablet by mouth daily.     bisacodyl (DULCOLAX) 5 MG EC tablet Take 5 mg by mouth daily as needed for moderate constipation.     buPROPion (WELLBUTRIN XL) 300 MG 24 hr tablet Take 1 tablet by mouth every morning.     busPIRone (BUSPAR) 10 MG tablet Take 10 mg by mouth daily.     calcium citrate-vitamin D (CITRACAL+D) 315-200 MG-UNIT tablet Take 1 tablet by mouth 2 (two) times daily.     Cetirizine HCl 10 MG CAPS Take 10 mg by mouth daily.     Cholecalciferol (VITAMIN D3) 50 MCG (2000 UT) CAPS Take by mouth.     Ferrous Sulfate (IRON PO) Take 65 mg by mouth daily.     Fexofenadine HCl (MUCINEX ALLERGY PO) Take by mouth as needed.     Folic Acid (FOLATE PO) Take 1 mg by mouth daily.     hydrochlorothiazide (HYDRODIURIL) 12.5 MG tablet Take 1 tablet (12.5 mg total) by mouth daily. 90 tablet 3   ipratropium (ATROVENT) 0.03 % nasal spray Place 2 sprays into the nose 2 (two) times daily. 30 mL 5   LORazepam (ATIVAN) 1  MG tablet Take 1 mg by mouth 3 (three) times daily as needed.     losartan (COZAAR) 50 MG tablet Take 1 tablet (50 mg total) by mouth daily. 30 tablet 2   Magnesium 250 MG TABS Take by mouth.     Multiple Vitamins-Minerals (ONE-A-DAY WOMENS 50+ PO) Take by mouth.     Omega-3 Fatty Acids (FISH OIL) 1000 MG CPDR Take by mouth.     ondansetron (ZOFRAN) 4 MG tablet Take 4 mg by mouth daily as needed.     pantoprazole (PROTONIX) 20 MG tablet Take 1 tablet (20 mg total) by mouth daily. 30 tablet 2   Potassium 99 MG TABS Take 1 tablet by mouth once.     pramipexole (MIRAPEX) 0.25 MG tablet Take 0.5 mg by mouth 3 (three) times daily.     vitamin C (ASCORBIC ACID) 500 MG tablet Take 500 mg by mouth daily.     No current facility-administered medications for this visit.    Allergies as of 11/07/2020 - Review Complete 11/07/2020  Allergen Reaction  Noted   Lyrica [pregabalin]  08/22/2020    Family History  Problem Relation Age of Onset   Hypertension Mother    AAA (abdominal aortic aneurysm) Mother    Colon polyps Father    Colon cancer Father    Hypertension Father    Prostate cancer Father    Clotting disorder Sister    Heart disease Brother    Heart disease Brother     Social History   Socioeconomic History   Marital status: Single    Spouse name: Not on file   Number of children: Not on file   Years of education: Not on file   Highest education level: Not on file  Occupational History   Not on file  Tobacco Use   Smoking status: Never   Smokeless tobacco: Never  Vaping Use   Vaping Use: Never used  Substance and Sexual Activity   Alcohol use: Never   Drug use: Never   Sexual activity: Not Currently  Other Topics Concern   Not on file  Social History Narrative   Not on file   Social Determinants of Health   Financial Resource Strain: Not on file  Food Insecurity: Not on file  Transportation Needs: Not on file  Physical Activity: Not on file  Stress: Not on file   Social Connections: Not on file  Intimate Partner Violence: Not on file    Review of Systems: 12 system ROS is negative except as noted above.   Physical Exam: General:   Alert,  well-nourished, pleasant and cooperative in NAD Head:  Normocephalic and atraumatic. Eyes:  Sclera clear, no icterus.   Conjunctiva pink. Ears:  Normal auditory acuity. Nose:  No deformity, discharge,  or lesions. Mouth:  No deformity or lesions.   Neck:  Supple; no masses or thyromegaly. Lungs:  Clear throughout to auscultation.   No wheezes. Heart:  Regular rate and rhythm; no murmurs. Abdomen:  Soft,nontender, nondistended, normal bowel sounds, no rebound or guarding. No hepatosplenomegaly.   Rectal:  Deferred  Msk:  Symmetrical. No boney deformities LAD: No inguinal or umbilical LAD Extremities:  No clubbing or edema. Neurologic:  Alert and  oriented x4;  grossly nonfocal Skin:  Intact without significant lesions or rashes. Psych:  Alert and cooperative. Normal mood and affect.      Adryen Cookson L. Orvan Falconer, MD, MPH 11/07/2020, 4:17 PM

## 2020-11-08 ENCOUNTER — Encounter: Payer: Self-pay | Admitting: Orthopedic Surgery

## 2020-11-08 ENCOUNTER — Other Ambulatory Visit: Payer: Self-pay

## 2020-11-08 ENCOUNTER — Ambulatory Visit (INDEPENDENT_AMBULATORY_CARE_PROVIDER_SITE_OTHER): Payer: Medicare Other | Admitting: Orthopedic Surgery

## 2020-11-08 DIAGNOSIS — M79672 Pain in left foot: Secondary | ICD-10-CM

## 2020-11-08 LAB — VITAMIN D 25 HYDROXY (VIT D DEFICIENCY, FRACTURES): Vit D, 25-Hydroxy: 55 ng/mL (ref 30–100)

## 2020-11-08 NOTE — Progress Notes (Signed)
Office Visit Note   Patient: Diana [name]           Date of Birth: 05-07-1955           MRN: [id number] Visit Date: 11/08/2020 Requested by: Avanell Shackleton, NP-C [address] [place],  [place] [address] PCP: Avanell Shackleton, NP-C  Subjective: Chief Complaint  Patient presents with   Left Foot - Follow-up    HPI: Latanya Hemmer is a 65 y.o. female who presents to the office complaining of left foot pain.  She is here to review CT scan of her left foot that was ordered to evaluate for nonunion of multiple metatarsal fractures.  She does report that her pain is very slowly improving but she has continued antalgia with walking.  She denies any history of smoking.  She takes vitamin D 400 units daily.  She has initiated process to start bone stimulator therapy which should start around 11/15/2020..                ROS: All systems reviewed are negative as they relate to the chief complaint within the history of present illness.  Patient denies fevers or chills.  Assessment & Plan: Visit Diagnoses:  1. Pain in left foot     Plan: Patient is a 65 year old female who returns for reevaluation of metatarsal fractures.  She had CT scan to evaluate for nonunion.  She does have increased bridging callus on the CT scan of both the second and third metatarsal fractures.  This callus is increased compared with prior radiographs back in June but she does have a continued visible fracture line of both metatarsal fractures despite the callus formation that is present.  Seems that the fractures are healing slowly but surely.  Recommended continuing with process to start bone stimulator given her continued pain and tenderness on exam.  She will start this in early August.  Additionally, despite her using vitamin D every day, she is on a fairly low daily dose and it would be good to check her vitamin D to ensure that she is not deficient.  We will call her with the lab results and send in supplement  if she needs it.  Follow-up in 6 weeks for clinical recheck with new radiographs at that time.  Follow-Up Instructions: No follow-ups on file.   Orders:  Orders Placed This Encounter  Procedures   Vitamin D (25 hydroxy)   No orders of the defined types were placed in this encounter.     Procedures: No procedures performed   Clinical Data: No additional findings.  Objective: Vital Signs: There were no vitals taken for this visit.  Physical Exam:  Constitutional: Patient appears well-developed HEENT:  Head: Normocephalic Eyes:EOM are normal Neck: Normal range of motion Cardiovascular: Normal rate Pulmonary/chest: Effort normal Neurologic: Patient is alert Skin: Skin is warm Psychiatric: Patient has normal mood and affect  Ortho Exam: Ortho exam demonstrates 1+ DP pulse of the left lower extremity.  No tenderness over the fifth metatarsal base.  Tenderness over the distal second and third metatarsals that she rates 8 and 7/10 respectively.  Multiple hammertoes throughout the left foot.  Specialty Comments:  No specialty comments available.  Imaging: No results found.   PMFS History: Patient Active Problem List   Diagnosis Date Noted   History of breast biopsy 08/22/2020   Hypertension    Hyperlipidemia    Osteoarthritis    Anxiety    Methotrexate, long term, current use  OSA on CPAP    Past Medical History:  Diagnosis Date   Anxiety    Colon polyp    Hyperlipidemia    Hypertension    Methotrexate, long term, current use    OSA on CPAP    Osteoarthritis     Family History  Problem Relation Age of Onset   Hypertension Mother    AAA (abdominal aortic aneurysm) Mother    Colon polyps Father    Colon cancer Father    Hypertension Father    Prostate cancer Father    Clotting disorder Sister    Heart disease Brother    Heart disease Brother     Past Surgical History:  Procedure Laterality Date   COLONOSCOPY     GASTRIC BYPASS     REPLACEMENT  TOTAL KNEE Right    Social History   Occupational History   Not on file  Tobacco Use   Smoking status: Never   Smokeless tobacco: Never  Vaping Use   Vaping Use: Never used  Substance and Sexual Activity   Alcohol use: Never   Drug use: Never   Sexual activity: Not Currently

## 2020-11-13 ENCOUNTER — Ambulatory Visit (INDEPENDENT_AMBULATORY_CARE_PROVIDER_SITE_OTHER): Payer: Medicare Other | Admitting: Family Medicine

## 2020-11-13 ENCOUNTER — Encounter: Payer: Self-pay | Admitting: Family Medicine

## 2020-11-13 VITALS — BP 136/72 | HR 72 | Temp 98.3°F | Ht 61.0 in | Wt 132.2 lb

## 2020-11-13 DIAGNOSIS — T63441A Toxic effect of venom of bees, accidental (unintentional), initial encounter: Secondary | ICD-10-CM

## 2020-11-13 DIAGNOSIS — Z9189 Other specified personal risk factors, not elsewhere classified: Secondary | ICD-10-CM | POA: Diagnosis not present

## 2020-11-13 MED ORDER — PREDNISONE 10 MG (21) PO TBPK: ORAL_TABLET | ORAL | 0 refills | Status: DC

## 2020-11-13 NOTE — Patient Instructions (Addendum)
Continue to ice and elevate the left arm. Continue your zyrtec daily. Since you don't tolerate benadryl, and your reaction is continuing to spread, we are going to treat you with a short course of steroids. Take them as directed. Let us know if you develop fever, worsening pain, spreading redness as this can indicate an infection which would need antibiotics.  This seems to be more a local reaction to the bee sting.  Bee, Wasp, or [employer], Adult Bees, wasps, and hornets are part of a family of insects that can sting people. These stings can cause pain and inflammation, but they are usually not serious. However, some people may have an allergic reaction to a sting. This can causethe symptoms to be more severe. What increases the risk? You may be at a greater risk of getting stung if you: Provoke a stinging insect by swatting or disturbing it. Wear strong-smelling soaps, deodorants, or body sprays. Spend time outdoors near gardens with flowers or fruit trees or in clothes that expose skin. Eat or drink outside. What are the signs or symptoms? Common symptoms of this condition include: A red lump in the skin that sometimes has a tiny hole in the center. In some cases, a stinger may be in the center of the wound. Pain and itching at the sting site. Redness and swelling around the sting site. If you have an allergic reaction (localized allergic reaction), the swelling and redness may spread out from the sting site. In some cases, this reaction can continue to develop over the next 24-48 hours. In rare cases, a person may have a severe allergic reaction (anaphylactic reaction) to a sting. Symptoms of an anaphylactic reaction may include: Wheezing or difficulty breathing. Raised, itchy, red patches on the skin (hives). Nausea or vomiting. Abdominal cramping. Diarrhea. Tightness in the chest or chest pain. Dizziness or fainting. Redness of the face (flushing). Hoarse voice. Swollen tongue,  lips, or face. How is this diagnosed? This condition is usually diagnosed based on your symptoms and medical history as well as a physical exam. You may have an allergy test to determine if you are allergic to the substance that the insect injected during the sting (venom). How is this treated? If you were stung by a bee, the stinger and a small sac of venom may be in the wound. It is important to remove the stinger as soon as possible. You can do this by brushing across the wound with gauze, a fingernail, or a flat card such as a credit card. Removing the stinger can help reduce the severity of yourbody's reaction to the sting. Most stings can be treated with: Icing to reduce swelling in the area. Medicines (antihistamines) to treat itching or an allergic reaction. Medicines to help reduce pain. These may be medicines that you take by mouth, or medicated creams or lotions that you apply to your skin. Pay close attention to your symptoms after you have been stung. If possible, have someone stay with you to make sure you do not have an allergic reaction. If you have any signs of an allergic reaction, call your health care provider. If you have ever had a severe allergic reaction, your health care provider may give you an inhaler or injectable medicine (epinephrine auto-injector) to use if necessary. Follow these instructions at home:  Wash the sting site 2-3 times each day with soap and water as told by your health care provider. Apply or take over-the-counter and prescription medicines only as told by  your health care provider. If directed, apply ice to the sting area. Put ice in a plastic bag. Place a towel between your skin and the bag. Leave the ice on for 20 minutes, 2-3 times a day. Do not scratch the sting area. If you had a severe allergic reaction to a sting, you may need: To wear a medical bracelet or necklace that lists the allergy. To learn when and how to use an anaphylaxis kit or  epinephrine injection. Your family members and coworkers may also need to learn this. To carry an anaphylaxis kit or epinephrine injection with you at all times. How is this prevented? Avoid swatting at stinging insects and disturbing insect nests. Do not use fragrant soaps or lotions. Wear shoes, pants, and long sleeves when spending time outdoors, especially in grassy areas where stinging insects are common. Keep outdoor areas free from nests or hives. Keep food and drink containers covered when eating outdoors. Avoid working or sitting near [employer], if possible. Wear gloves if you are gardening or working outdoors. If an attack by a stinging insect or a swarm seems likely in the moment, move away from the area or find a barrier between you and the insect(s), such as a door. Contact a health care provider if: Your symptoms do not get better in 2-3 days. You have redness, swelling, or pain that spreads beyond the area of the sting. You have a fever. Get help right away if: You have symptoms of a severe allergic reaction. These include: Wheezing or difficulty breathing. Tightness in the chest or chest pain. Light-headedness or fainting. Itchy, raised, red patches on the skin. Nausea or vomiting. Abdominal cramping. Diarrhea. A swollen tongue or lips, or trouble swallowing. Dizziness or fainting. Summary Stings from bees, wasps, and hornets can cause pain and inflammation, but they are usually not serious. However, some people may have an allergic reaction to a sting. This can cause the symptoms to be more severe. Pay close attention to your symptoms after you have been stung. If possible, have someone stay with you to make sure you do not have an allergic reaction. Call your health care provider if you have any signs of an allergic reaction. This information is not intended to replace advice given to you by your health care provider. Make sure you discuss any questions you have  with your healthcare provider. Document Revised: 01/25/2020 Document Reviewed: 01/25/2020 Elsevier Patient Education  2022 [employer].

## 2020-11-13 NOTE — Progress Notes (Signed)
Chief Complaint  Patient presents with   Insect Bite    Bee sting, Friday night. Has tried everything from calamine, triple antibiotic to desitin. Hasn't taken any benadryl due to adverse reaction. Takes zyrtec daily.    Other    Has question about med check tomorrow with [facility]-her GYN suggested a visit with a pharmacist type person to look at her med list as she thinks she is on too many different meds-she called here to get set up with the new pharmacist we are supposed to be getting. She wants to know when we are getting and if she should wait rather than seeing Dr. Susann Givens. Basically she is asking if this is something that Dr.Lalonde can do and she won't have to come back once pharmacist is here.    While plugging in the bug zapper on Friday night, she felt a sting at her left arm. Smacked it/killed it. Daughter looked with magnifying glass, didn't see stinger. It has gotten a little bigger, redness is spreading. It is itchy, slightly painful (but improving)  No fever or chills  Benadryl makes her a "spastic elastic", feels "high on caffeine", doesn't like how she feels on it. Recalls tolerating prednisone in the past.  No f/c/n/v/d/hives/other rashes No chest pain, shortness of breath or wheezing  GYN suggested someone evaluate her medications, has med check with Dr. Susann Givens for tomorrow (she is Diana [name]'s patient), wonders if she should wait until pharmacist can see her here.  Medications were reviewed--much of the higher risk meds are all being prescribed by her psychiatrist. She states she has upcoming appointment with psych.  PMH, PSH, SH reviewed  Outpatient Encounter Medications as of 11/13/2020  Medication Sig Note   acetaminophen (TYLENOL) 500 MG tablet Take 1,000 mg by mouth 2 (two) times daily.    atorvastatin (LIPITOR) 10 MG tablet Take 1 tablet by mouth at bedtime.    B Complex Vitamins (B COMPLEX 100 PO) Take 1 tablet by mouth daily.    bisacodyl (DULCOLAX) 5 MG EC tablet  Take 5 mg by mouth daily as needed for moderate constipation.    buPROPion (WELLBUTRIN XL) 300 MG 24 hr tablet Take 1 tablet by mouth every morning.    busPIRone (BUSPAR) 10 MG tablet Take 10 mg by mouth daily.    calcium citrate-vitamin D (CITRACAL+D) 315-200 MG-UNIT tablet Take 1 tablet by mouth 2 (two) times daily.    Cetirizine HCl 10 MG CAPS Take 10 mg by mouth daily.    Cholecalciferol (VITAMIN D3) 50 MCG (2000 UT) CAPS Take by mouth.    escitalopram (LEXAPRO) 10 MG tablet Take 10 mg by mouth daily.    Ferrous Sulfate (IRON PO) Take 65 mg by mouth daily.    Folic Acid (FOLATE PO) Take 1 mg by mouth daily.    hydrochlorothiazide (HYDRODIURIL) 12.5 MG tablet Take 1 tablet (12.5 mg total) by mouth daily.    LORazepam (ATIVAN) 1 MG tablet Take 1 mg by mouth 3 (three) times daily as needed.    losartan (COZAAR) 50 MG tablet Take 1 tablet (50 mg total) by mouth daily.    Magnesium 250 MG TABS Take by mouth.    Multiple Vitamins-Minerals (ONE-A-DAY WOMENS 50+ PO) Take by mouth.    Omega-3 Fatty Acids (FISH OIL) 1000 MG CPDR Take 1 capsule by mouth daily.    ondansetron (ZOFRAN) 4 MG tablet Take 4 mg by mouth daily as needed.    pantoprazole (PROTONIX) 20 MG tablet Take 1 tablet (20 mg  total) by mouth daily.    Potassium 99 MG TABS Take 1 tablet by mouth once.    pramipexole (MIRAPEX) 0.25 MG tablet Take 0.5 mg by mouth 3 (three) times daily. 11/13/2020: Takes 2 at bedtime, and in the middle of the night just if she wakes up needing it   predniSONE (STERAPRED UNI-PAK 21 TAB) 10 MG (21) TBPK tablet Take as directed    traZODone (DESYREL) 100 MG tablet Take 100 mg by mouth at bedtime.    vitamin C (ASCORBIC ACID) 500 MG tablet Take 500 mg by mouth daily.    zolpidem (AMBIEN CR) 12.5 MG CR tablet Take 12.5 mg by mouth at bedtime.    Fexofenadine HCl (MUCINEX ALLERGY PO) Take by mouth as needed. (Patient not taking: Reported on 11/13/2020)    ipratropium (ATROVENT) 0.03 % nasal spray Place 2 sprays  into the nose 2 (two) times daily. (Patient not taking: Reported on 11/13/2020)    No facility-administered encounter medications on file as of 11/13/2020.   Not taking steroid pack prior to today's visit.  Allergies  Allergen Reactions   Lyrica [Pregabalin]     ROS: no fever, chills, URI symptoms, headache, dizziness, shortness of breath, GI complaints, bleeding or other concerns.  See HPI.   PHYSICAL EXAM:  BP 136/72   Pulse 72   Temp 98.3 F (36.8 C) (Tympanic)   Ht 5\' 1"  (1.549 m)   Wt 132 lb 3.2 oz (60 kg)   BMI 24.98 kg/m   Pleasant, well-appearing female in no distress HEENT: conjunctiva and sclera are clear, EOMI, wearing mask Heart: regular rate and rhythm Lungs: clear bilaterally, no wheezing Skin: L arm--medial aspect of distal portion of upper arm (just above the elbow). 7.5 x 7cm of deeper red, though full area that is red/pink extends 16-18cm (across the whole back of the arm, sparing only anterior portion). The area is soft, not indurated. Central area has slight blue discoloration--under magnification, no e/o foreign body, and normal sensation, nontender. No streaking   ASSESSMENT/PLAN:   Bee sting, accidental or unintentional, initial encounter - risks/SE of steroids reviewed, will start given ongoing spread. Trial antihistamines as well (not benadryl, doesn't tolerate). f/u if fever, pain, spreading - Plan: predniSONE (STERAPRED UNI-PAK 21 TAB) 10 MG (21) TBPK tablet  At high risk for adverse medication event - on multiple potentially sedating medications.  All prescribed by her psych. She will discuss meds at upcoming visit with psych, and consider seeing pharm.  Meds reviewed--high risk meds are her psychiatric meds. She sees psych in 2 weeks. Will talk to her first regarding high risk med use for her age (>90), specifically the ativan, trazodone, ambien (dosed higher than recommended for age) Can cancel the visit scheduled with [facility] for tomorrow.  I spent  34 minutes dedicated to the care of this patient, including pre-visit review of records, face to face time, post-visit ordering of testing and documentation.  Continue to ice and elevate the left arm. Continue your zyrtec daily. Since you don't tolerate benadryl, and your reaction is continuing to spread, we are going to treat you with a short course of steroids. Take them as directed. Let (>77 know if you develop fever, worsening pain, spreading redness as this can indicate an infection which would need antibiotics.  This seems to be more a local reaction to the bee sting.

## 2020-11-14 ENCOUNTER — Encounter: Payer: Medicare Other | Admitting: Family Medicine

## 2020-11-14 ENCOUNTER — Telehealth: Payer: Self-pay | Admitting: Family Medicine

## 2020-11-14 NOTE — Telephone Encounter (Signed)
Pt needs appoint with eye doctor.  She had cataract surgery several years ago and her eye care dr in [place] can't see her until November so she wants to have new eye care doctor here. Please call pt [phone]

## 2020-11-14 NOTE — Telephone Encounter (Signed)
Pt was notified. Told to contact [facility]

## 2020-11-15 NOTE — Progress Notes (Signed)
Office Visit Note  Patient: Diana [name]             Date of Birth: March 29, 1956           MRN: [id number]             PCP: Avanell Shackleton, NP-C Referring: Cammy Copa, MD Visit Date: 11/16/2020   Subjective:  New Patient (Initial Visit) (Bil hand pain and swelling)   History of Present Illness: Diana [name] is a 65 y.o. female here for seronegative arthritis for which she has taken methotrexate. She has history of right knee arthroplasty and recent right metatarsal fracture. She has a history of age related osteoporosis. She has had previous gastric bypass surgery.  She was previously a patient of Dr. Aline August in [place] and has moved to this area no longer on any specific RA treatments.  Verbal recounting of previous treatment history is unclear--for example she reports previous treatment for osteoporosis with Fosamax that was replaced with subcutaneous methotrexate Currently her biggest problem is joint pain in her bilateral hands with multiple deformities worst in the thumbs but also affecting the other digits.  She is very distressed by the right foot metatarsal fractures and deformity but denies having significant joint pain in that area currently.  She is also interested in continuing the treatment for her restless leg syndrome that has apparently been severe but improved with the pramipexole.   Activities of Daily Living:  Patient reports morning stiffness for 1 hour.   Patient Denies nocturnal pain.  Difficulty dressing/grooming: Denies Difficulty climbing stairs: Denies Difficulty getting out of chair: Denies Difficulty using hands for taps, buttons, cutlery, and/or writing: Reports  Review of Systems  Constitutional:  Positive for fatigue.  HENT:  Positive for mouth dryness.   Eyes:  Negative for dryness.  Respiratory:  Negative for shortness of breath.   Cardiovascular:  Negative for swelling in legs/feet.  Gastrointestinal:  Positive for  constipation.  Endocrine: Positive for increased urination.  Genitourinary:  Negative for difficulty urinating.  Musculoskeletal:  Positive for joint pain, gait problem, joint pain, joint swelling, muscle weakness, morning stiffness and muscle tenderness.  Skin:  Negative for rash.  Allergic/Immunologic: Negative for susceptible to infections.  Neurological:  Positive for numbness and weakness.  Hematological:  Positive for bruising/bleeding tendency.  Psychiatric/Behavioral:  Positive for sleep disturbance.    PMFS History:  Patient Active Problem List   Diagnosis Date Noted   Restless leg syndrome 11/16/2020   History of breast biopsy 08/22/2020   Hypertension    Hyperlipidemia    Osteoarthritis    Anxiety    Methotrexate, long term, current use    OSA on CPAP     Past Medical History:  Diagnosis Date   Anxiety    Colon polyp    Hyperlipidemia    Hypertension    Methotrexate, long term, current use    OSA on CPAP    Osteoarthritis     Family History  Problem Relation Age of Onset   Hypertension Mother    AAA (abdominal aortic aneurysm) Mother    Colon polyps Father    Colon cancer Father    Hypertension Father    Prostate cancer Father    Clotting disorder Sister    Heart disease Brother    Heart disease Brother    Past Surgical History:  Procedure Laterality Date   CATARACT EXTRACTION     CHOLECYSTECTOMY     COLONOSCOPY     GASTRIC BYPASS  HERNIA REPAIR     REPLACEMENT TOTAL KNEE Right    SINUS IRRIGATION     TONSILLECTOMY     Social History   Social History Narrative   Not on file   Immunization History  Administered Date(s) Administered   Influenza,inj,Quad PF,6+ Mos 01/13/2014   Influenza-Unspecified 12/14/2016, 12/15/2018, 12/15/2019   Moderna Sars-Covid-2 Vaccination 05/21/2019, 06/18/2019, 03/07/2020, 07/13/2020   Pneumococcal Polysaccharide-23 03/24/2014     Objective: Vital Signs: BP 123/67 (BP Location: Right Arm, Patient Position:  Sitting, Cuff Size: Normal)   Pulse 71   Resp 16   Ht 5\' 1"  (1.549 m)   Wt 133 lb (60.3 kg)   BMI 25.13 kg/m    Physical Exam HENT:     Mouth/Throat:     Mouth: Mucous membranes are moist.     Pharynx: Oropharynx is clear.  Cardiovascular:     Rate and Rhythm: Normal rate and regular rhythm.  Pulmonary:     Effort: Pulmonary effort is normal.     Breath sounds: Normal breath sounds.  Skin:    General: Skin is warm and dry.  Neurological:     Mental Status: She is alert.  Psychiatric:     Comments: Thought content appropriate, somewhat tearful recounting symptoms and medical events     Musculoskeletal Exam:  Elbows full ROM no tenderness or swelling Wrists full ROM no tenderness or swelling Multiple hand deformities with severe first CMC squaring and MCP hyperextension, swan-neck deformities with PIP joint hyperextension worse in the fourth digits, DIP Heberden's nodes on both hands with flexion contractures Knees full ROM no tenderness or swelling Ankles full ROM no tenderness or swelling   Investigation: No additional findings.  Imaging: CT FOOT LEFT WO CONTRAST  Result Date: 10/30/2020 CLINICAL DATA:  CT lef tfoot eval for nonunion MT fractures EXAM: CT OF THE LEFT FOOT WITHOUT CONTRAST TECHNIQUE: Multidetector CT imaging of the left foot was performed according to the standard protocol. Multiplanar CT image reconstructions were also generated. COMPARISON:  Radiograph 09/13/2020 FINDINGS: Bones/Joint/Cartilage There are subacute extra-articular fractures of the distal second and third metatarsals, both with some bridging callus but persistent visible fracture lines. There is a healing, extra-articular proximal fourth metatarsal fracture as well which has evidence of bony bridging. Cortical irregularity of the fifth digit middle phalanx (sagittal series 8, image 38). There is severe osteoarthritis of the second and third tarsometatarsal joints and along the dorsal aspect of  the first and fourth tarsometatarsal joints. There is mild tibiotalar and talonavicular arthritis. There are multiple flexion deformities of the lesser digits. Ligaments Suboptimally assessed by CT. Muscles and Tendons No significant muscle atrophy. There is no acute tendon pathology noted on noncontrast CT. Soft tissues Mild generalized soft tissue swelling. IMPRESSION: Subacute extra-articular fractures of the distal second and third metatarsals, both with some bridging callus but persistent visible fracture lines. Healing, extra-articular proximal fourth metatarsal fracture with evidence of bony bridging. Cortical irregularity of the fifth digit middle phalanx, could represent aa nondisplaced fracture, correlate with point tenderness. Severe osteoarthritis of the second and third tarsometatarsal joints and along the dorsal aspects of the first and fourth tarsometatarsal joints. Electronically Signed   By: 11/13/2020   On: 10/30/2020 12:06   XR Foot Complete Left  Result Date: 11/17/2020 AP, oblique, lateral views of left foot reviewed.  Distal second and third metatarsal fractures again noted with surrounding callus but continued fracture line that is visible.  No new fracture or dislocation noted.   Recent Labs:  Lab Results  Component Value Date   WBC 3.4 08/10/2020   HGB 12.4 08/10/2020   PLT 257 08/10/2020   NA 141 08/10/2020   K 3.6 08/10/2020   CL 101 08/10/2020   CO2 27 08/10/2020   GLUCOSE 97 08/10/2020   BUN 11 08/10/2020   CREATININE 0.78 08/10/2020   BILITOT 0.4 08/10/2020   ALKPHOS 135 (H) 08/10/2020   AST 15 08/10/2020   ALT 13 08/10/2020   PROT 6.5 08/10/2020   ALBUMIN 4.3 08/10/2020   CALCIUM 9.7 08/10/2020    Speciality Comments: No specialty comments available.  Procedures:  No procedures performed Allergies: Lyrica [pregabalin]   Assessment / Plan:     Visit Diagnoses: Osteoarthritis, unspecified osteoarthritis type, unspecified site - Plan: Ambulatory referral  to Occupational Therapy  She also demonstrates advanced degenerative changes in multiple joints worst in the thumbs but pretty significant throughout bilaterally.  Will refer to occupational therapy for help with any exercises, joint protection modification, or devices that may be helpful.  Can discuss local versus systemic options, mostly depending on whether to resume DMARD treatment.   Methotrexate, long term, current use  She reports current use of methotrexate but is off this medication and cannot confirm active synovitis from the exam today.  Requesting outside medical records from previous rheumatology office with Dr. Aline August. I recommend her to continue off this medicine for now we will be following up to see if there is any change in evidence of inflammation we will also review her outside labs for medication monitoring and whether or not an updated x-rays are needed.  Restless leg syndrome - Plan: pramipexole (MIRAPEX) 0.25 MG tablet  Will reorder pramipexole 0.25 mg tablets can take up to 3 at night as needed for restless leg syndrome.  We will review previous prescribing history from outside records.  Orders: Orders Placed This Encounter  Procedures   Ambulatory referral to Occupational Therapy   Meds ordered this encounter  Medications   pramipexole (MIRAPEX) 0.25 MG tablet    Sig: Take 3 tablets (0.75 mg total) by mouth at bedtime as needed.    Dispense:  90 tablet    Refill:  2     Follow-Up Instructions: Return in about 2 weeks (around 11/30/2020) for New pt OP+?RA records f/u 2wks.   Fuller Plan, MD  Note - This record has been created using [employer].  Chart creation errors have been sought, but may not always  have been located. Such creation errors do not reflect on  the standard of medical care.

## 2020-11-16 ENCOUNTER — Ambulatory Visit (INDEPENDENT_AMBULATORY_CARE_PROVIDER_SITE_OTHER): Payer: Medicare Other | Admitting: Internal Medicine

## 2020-11-16 ENCOUNTER — Encounter: Payer: Self-pay | Admitting: Internal Medicine

## 2020-11-16 ENCOUNTER — Other Ambulatory Visit: Payer: Self-pay

## 2020-11-16 VITALS — BP 123/67 | HR 71 | Resp 16 | Ht 61.0 in | Wt 133.0 lb

## 2020-11-16 DIAGNOSIS — M1990 Unspecified osteoarthritis, unspecified site: Secondary | ICD-10-CM | POA: Diagnosis not present

## 2020-11-16 DIAGNOSIS — G2581 Restless legs syndrome: Secondary | ICD-10-CM | POA: Diagnosis not present

## 2020-11-16 DIAGNOSIS — Z79899 Other long term (current) drug therapy: Secondary | ICD-10-CM | POA: Diagnosis not present

## 2020-11-16 DIAGNOSIS — Z79631 Long term (current) use of antimetabolite agent: Secondary | ICD-10-CM

## 2020-11-16 MED ORDER — PRAMIPEXOLE DIHYDROCHLORIDE 0.25 MG PO TABS: 0.7500 mg | ORAL_TABLET | Freq: Every evening | ORAL | 2 refills | Status: DC | PRN

## 2020-11-17 ENCOUNTER — Ambulatory Visit (INDEPENDENT_AMBULATORY_CARE_PROVIDER_SITE_OTHER): Payer: Medicare Other

## 2020-11-17 ENCOUNTER — Ambulatory Visit (INDEPENDENT_AMBULATORY_CARE_PROVIDER_SITE_OTHER): Payer: Medicare Other | Admitting: Orthopedic Surgery

## 2020-11-17 ENCOUNTER — Encounter: Payer: Self-pay | Admitting: Orthopedic Surgery

## 2020-11-17 DIAGNOSIS — M79672 Pain in left foot: Secondary | ICD-10-CM

## 2020-11-17 DIAGNOSIS — M0600 Rheumatoid arthritis without rheumatoid factor, unspecified site: Secondary | ICD-10-CM

## 2020-11-17 NOTE — Progress Notes (Signed)
Office Visit Note   Patient: Diana [name]           Date of Birth: Jul 14, 1955           MRN: [id number] Visit Date: 11/17/2020 Requested by: Avanell Shackleton, NP-C [id number] [place],  [place] [address] PCP: Avanell Shackleton, NP-C  Subjective: Chief Complaint  Patient presents with   Left Foot - Follow-up    HPI: Alyssamae Klinck is a 65 y.o. female who presents to the office complaining of left foot pain.  Patient returns with complaint of left foot pain that is worse with walking.  She takes Tylenol for pain control.  She was recently seen about a week ago for review of left foot CT scan demonstrating callus formation but continued fracture line that is visible with no complete union at the fracture site.  No new injuries or falls.  She had her vitamin D checked at her last appointment that was 55.  She is being set up for bone stimulator but needs new x-rays for this.              ROS: All systems reviewed are negative as they relate to the chief complaint within the history of present illness.  Patient denies fevers or chills.  Assessment & Plan: Visit Diagnoses:  1. Seronegative rheumatoid arthritis (HCC)   2. 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  Radiographs taken today demonstrate callus formation with nonhealed fracture line consistent with prior CT scan.  Her vitamin D was checked and found to be sufficient at 55.  Plan to continue with her over-the-counter vitamin D supplementation as well as continue getting her set up for bone stimulator.  Follow-up in 6 weeks for clinical recheck with new radiographs at the time.  Follow-Up Instructions: No follow-ups on file.   Orders:  Orders Placed This Encounter  Procedures   XR Foot Complete Left   No orders of the defined types were placed in this encounter.     Procedures: No procedures  performed   Clinical Data: No additional findings.  Objective: Vital Signs: There were no vitals taken for this visit.  Physical Exam:  Constitutional: Patient appears well-developed HEENT:  Head: Normocephalic Eyes:EOM are normal Neck: Normal range of motion Cardiovascular: Normal rate Pulmonary/chest: Effort normal Neurologic: Patient is alert Skin: Skin is warm Psychiatric: Patient has normal mood and affect  Ortho Exam: Ortho exam demonstrates left foot with 2+ DP pulse.  Moderate tenderness over the second and third distal metatarsals.  She is able to weight-bear with slight antalgia.  Specialty Comments:  No specialty comments available.  Imaging: No results found.   PMFS History: Patient Active Problem List   Diagnosis Date Noted   Restless leg syndrome 11/16/2020   History of breast biopsy 08/22/2020   Hypertension    Hyperlipidemia    Osteoarthritis    Anxiety    Methotrexate, long term, current use    OSA on CPAP    Past Medical History:  Diagnosis Date   Anxiety    Colon polyp    Hyperlipidemia    Hypertension    Methotrexate, long term, current use    OSA on CPAP    Osteoarthritis     Family History  Problem Relation Age of Onset   Hypertension Mother    AAA (abdominal aortic aneurysm) Mother  Colon polyps Father    Colon cancer Father    Hypertension Father    Prostate cancer Father    Clotting disorder Sister    Heart disease Brother    Heart disease Brother     Past Surgical History:  Procedure Laterality Date   CATARACT EXTRACTION     CHOLECYSTECTOMY     COLONOSCOPY     GASTRIC BYPASS     HERNIA REPAIR     REPLACEMENT TOTAL KNEE Right    SINUS IRRIGATION     TONSILLECTOMY     Social History   Occupational History   Not on file  Tobacco Use   Smoking status: Never   Smokeless tobacco: Never  Vaping Use   Vaping Use: Never used  Substance and Sexual Activity   Alcohol use: Never   Drug use: Never   Sexual activity:  Not Currently

## 2020-11-28 ENCOUNTER — Ambulatory Visit: Payer: Medicare Other | Attending: Family Medicine | Admitting: Occupational Therapy

## 2020-11-28 ENCOUNTER — Other Ambulatory Visit: Payer: Self-pay

## 2020-11-28 DIAGNOSIS — M25542 Pain in joints of left hand: Secondary | ICD-10-CM

## 2020-11-28 DIAGNOSIS — M6281 Muscle weakness (generalized): Secondary | ICD-10-CM | POA: Diagnosis present

## 2020-11-28 DIAGNOSIS — M25642 Stiffness of left hand, not elsewhere classified: Secondary | ICD-10-CM

## 2020-11-28 DIAGNOSIS — R278 Other lack of coordination: Secondary | ICD-10-CM | POA: Diagnosis present

## 2020-11-28 DIAGNOSIS — M25641 Stiffness of right hand, not elsewhere classified: Secondary | ICD-10-CM | POA: Diagnosis present

## 2020-11-28 DIAGNOSIS — M25541 Pain in joints of right hand: Secondary | ICD-10-CM | POA: Diagnosis present

## 2020-11-28 NOTE — Therapy (Signed)
[facility] [address] [place], [place], [address] Phone: [phone]   Fax:  [phone]  Occupational Therapy Evaluation  Patient Details  Name: Diana [name] MRN: [id number] Date of Birth: 11/14/1955 Referring Provider (OT): Sheliah Hatch, MD   Encounter Date: 11/28/2020   OT End of Session - 11/28/20 1032     Visit Number 1    Number of Visits 9    Date for OT Re-Evaluation 02/06/21   8 visits over 10 weeks d/t scheduling conflicts that may arise   Authorization Type Medicare and [employer] Supplement    Authorization Time Period Covered 100%, Follow Medicare Guidelines    Progress Note Due on Visit 10    OT Start Time 1014    OT Stop Time 1100    OT Time Calculation (min) 46 min    Activity Tolerance Patient tolerated treatment well    Behavior During Therapy WFL for tasks assessed/performed             Past Medical History:  Diagnosis Date   Anxiety    Colon polyp    Hyperlipidemia    Hypertension    Methotrexate, long term, current use    OSA on CPAP    Osteoarthritis     Past Surgical History:  Procedure Laterality Date   CATARACT EXTRACTION     CHOLECYSTECTOMY     COLONOSCOPY     GASTRIC BYPASS     HERNIA REPAIR     REPLACEMENT TOTAL KNEE Right    SINUS IRRIGATION     TONSILLECTOMY      There were no vitals filed for this visit.   Subjective Assessment - 11/28/20 1047     Subjective  Pt is a 65 year old female that currently lives with her daughter in a one story home. Pt is completing with ADLs and IADLs with increased time but reports increased pain and decreased strength in BUE d/t OA in BUE thumbs. Pt enjoys sewing, playing cards and hanging out with family. Pt reports increased difficulty with opening containers, cutting up vegetables and carrying groceries. Pt reports goal to "maintain what she currently has as it is probably going to get worse."    Pertinent History  Osteoarthritis, seronegative rheumatoid arthritis, HTN, HLD, Anxiety, sleep apnea    Patient Stated Goals "maintain what I currently have as it is probably going to get worse."    Currently in Pain? Yes    Pain Score 8     Pain Location Hand    Pain Orientation Right;Left    Pain Descriptors / Indicators Aching;Squeezing    Pain Type Chronic pain    Pain Onset More than a month ago    Pain Frequency Constant               [facility] OT Assessment - 11/28/20 1020       Assessment   Medical Diagnosis Osteoarthritis    Referring Provider (OT) Sheliah Hatch, MD    Onset Date/Surgical Date --   progressively gotten worse over 20 years   Hand Dominance Right    Prior Therapy previously had PT for knee replacement x 2      Precautions   Precautions Fall    Required Braces or Orthoses Other Brace/Splint      Restrictions   Other Position/Activity Restrictions pt currently report 2 broken bones on her LLE foot but reports MD did not tell her to remain off of it  Balance Screen   Has the patient fallen in the past 6 months Yes    How many times? yesterday      Home  Environment   Family/patient expects to be discharged to: Private residence    Living Arrangements Children   + cat   Type of Home House    Home Layout One level    Bathroom Shower/Tub Tub/Shower unit    Bathroom Accessibility Yes    Home Equipment [place] - single point;Walker - 2 wheels    Lives With Daughter      Prior Function   Level of Independence Independent    Vocation Retired    Leisure play cards, sewing, old movies, visiting family, reading      ADL   Eating/Feeding Modified independent    Grooming Modified independent    Upper Body Bathing Modified independent    Lower Body Bathing Modified independent    Upper Body Dressing Independent    Lower Body Dressing Modified independent    Toilet Transfer Modified independent    Toileting - Clothing Manipulation Modified independent    Toileting -   Hygiene Modified Independent    Tub/Shower Transfer Modified independent      IADL   Shopping Takes care of all shopping needs independently   weight of the bags   Light Housekeeping Maintains house alone or with occasional assistance   spaces it out   Meal Prep Plans, prepares and serves adequate meals independently   reports cutting up vegetables is difficult. pt also reports that opening jars, mouthwash and medications is difficult   Community Mobility Drives own vehicle    Medication Management Is responsible for taking medication in correct dosages at correct time    Development worker, community financial matters independently (budgets, writes checks, pays rent, bills goes to bank), collects and keeps track of income      Mobility   Mobility Status History of falls      Written Expression   Dominant Hand Right    Handwriting 75% legible   increased fatigue and pain with more writing     Vision - History   Baseline Vision Wears glasses only for reading    Visual History Cataracts    Additional Comments reports some changes - will educate on medication side effects and going to see eye doctor for acuity assessment      Observation/Other Assessments   Focus on Therapeutic Outcomes (FOTO)  N/A    Other Surveys  Select    Quick DASH  52% deficit    Upper Extremity Functional Index  46% deficit      Sensation   Light Touch Appears Intact    Hot/Cold Appears Intact      Coordination   9 Hole Peg Test Right;Left    Right 9 Hole Peg Test 25.87s    Left 9 Hole Peg Test 27.81s      ROM / Strength   AROM / PROM / Strength AROM;Strength      AROM   Overall AROM  Within functional limits for tasks performed      Strength   Overall Strength Deficits    Overall Strength Comments grossly 4-/5 BUE      Hand Function   Right Hand Gross Grasp Functional    Right Hand Grip (lbs) 24.9    Right Hand Lateral Pinch 8 lbs    Right Hand 3 Point Pinch 5 lbs   tip - 4   Left Hand Gross  Grasp Functional    Left Hand Grip (lbs) 29.3    Left Hand Lateral Pinch 6 lbs    Left 3 point pinch 6 lbs   tip - 4                              OT Short Term Goals - 11/28/20 1142       OT SHORT TERM GOAL #1   Title Pt will verbalize understanding of joint protection strategies    Time 4    Period Weeks    Status New    Target Date 12/26/20      OT SHORT TERM GOAL #2   Title Pt will be independent with grip strength and coordination HEP    Time 4    Period Weeks    Status New      OT SHORT TERM GOAL #3   Title Pt will verbalize understanding of pain management strategies    Time 4    Period Weeks    Status New      OT SHORT TERM GOAL #4   Title Pt will be independent with wear and care of any splints and/or braces for BUE PRN    Time 4    Period Weeks    Status New               OT Long Term Goals - 11/28/20 1143       OT LONG TERM GOAL #1   Title Pt will be independent with any updated HEPs    Time 10    Period Weeks    Status New    Target Date 02/06/21      OT LONG TERM GOAL #2   Title Pt will increase grip strength, bilaterally, to 30 lbs or greater to increase functional use of BUE.    Baseline R 24.9 lbs, L 29.3 lbs    Time 10    Period Weeks    Status New      OT LONG TERM GOAL #3   Title Pt will increase write a paragraph of 5-7 sentences with pain no greater than 6/10 and adapted strategies PRN    Time 10    Period Weeks    Status New      OT LONG TERM GOAL #4   Title Pt will verbalize understanding of adapted strategies for increasing ease and decreasing pain with ADLs and IADLs (cutting vegetables, opening containers (jar, mouthwash), shuffling cards, etc)    Time 10    Period Weeks    Status New                   Plan - 11/28/20 1047     Clinical Impression Statement Pt is a 65 year old female that presents to [facility] with bilateral hand pain with osteoarthritis (thumb CMC). Pt presents with decreased  strength, increased pain, joint instability and stiffness. Pt would benefit from education and possible splints and/or braces for increasing comfort and decreasing pain in BUE. Skilled occupational therapy is recommended to target listed areas of deficit and increase independence.    OT Occupational Profile and History Problem Focused Assessment - Including review of records relating to presenting problem    Occupational performance deficits (Please refer to evaluation for details): ADL's;IADL's;Leisure    Body Structure / Function / Physical Skills UE functional use;Strength;Decreased knowledge of use of DME;ADL;FMC;Flexibility;Coordination;ROM;IADL;Pain;GMC;Dexterity    Rehab Potential Good  Clinical Decision Making Limited treatment options, no task modification necessary    Comorbidities Affecting Occupational Performance: None    Modification or Assistance to Complete Evaluation  No modification of tasks or assist necessary to complete eval    OT Frequency 1x / week    OT Duration Other (comment)   8 visits over 10 weeks d/t any scheduling conflicts that may come up   OT Treatment/Interventions Moist Heat;Fluidtherapy;Self-care/ADL training;Contrast Bath;Cryotherapy;Energy conservation;Therapeutic exercise;Ultrasound;Paraffin;Patient/family education;Passive range of motion;Therapeutic activities;Splinting;DME and/or AE instruction    Plan CMC short opponens splints for BUE or thumb spica (whichever works best for patient), joint protections strategies    Consulted and Agree with Plan of Care Patient             Patient will benefit from skilled therapeutic intervention in order to improve the following deficits and impairments:   Body Structure / Function / Physical Skills: UE functional use, Strength, Decreased knowledge of use of DME, ADL, FMC, Flexibility, Coordination, ROM, IADL, Pain, GMC, Dexterity       Visit Diagnosis: Pain in joint of left hand  Pain in joint of right  hand  Stiffness of left hand, not elsewhere classified  Stiffness of right hand, not elsewhere classified  Muscle weakness (generalized)  Other lack of coordination    Problem List Patient Active Problem List   Diagnosis Date Noted   Restless leg syndrome 11/16/2020   History of breast biopsy 08/22/2020   Hypertension    Hyperlipidemia    Osteoarthritis    Anxiety    Methotrexate, long term, current use    OSA on CPAP     Jabril Pursell M Saturnino Liew MOT, OTR/L  11/28/2020, 12:30 PM  [facility] [address] [place], [place], [address] Phone: [phone]   Fax:  [phone]  Name: Diana [name] MRN: [id number] Date of Birth: 1955-10-24

## 2020-12-03 NOTE — Progress Notes (Signed)
Office Visit Note  Patient: Diana [name]             Date of Birth: 1956-03-11           MRN: [id number]             PCP: Girtha Rm, NP-C Referring: Girtha Rm, NP-C Visit Date: 12/04/2020   Subjective:  Follow-up (Patient complains of continued bilateral hand pain and swelling. )   History of Present Illness: Diana [name] is a 65 y.o. female here for follow up for seronegative RA previously on methotrexate also osteoarthritis of multiple joints and recent metatarsal fractures. She feels about the same as at our previous visit most affected joints are in her hands especially thumbs. I was able to review documents detailing previous workup and treated with Dr. Vertell Limber in [place]. Findings there were consistent with extensive DIP predominant osteoarthritis possible erosive OA disease. Negative RF and CCP serology with normal inflammatory markers. She did show positive CarP testing on [facility] labs. Treatment with methotrexate started based on seronegative RA by 2010 criteria met not clear whether she ever had large improvement in symptoms or change in synovitis. Lab tests with normal vitamin D level in July and normal calcium and renal function in April.  Previous HPI: 11/16/20 Diana [name] is a 65 y.o. female here for seronegative arthritis for which she has taken methotrexate. She has history of right knee arthroplasty and recent right metatarsal fracture. She has a history of age related osteoporosis. She has had previous gastric bypass surgery.  She was previously a patient of Dr. Vertell Limber in [place] and has moved to this area no longer on any specific RA treatments.  Verbal recounting of previous treatment history is unclear--for example she reports previous treatment for osteoporosis with Fosamax that was replaced with subcutaneous methotrexate Currently her biggest problem is joint pain in her bilateral hands with multiple deformities worst in the thumbs  but also affecting the other digits.  She is very distressed by the right foot metatarsal fractures and deformity but denies having significant joint pain in that area currently.  She is also interested in continuing the treatment for her restless leg syndrome that has apparently been severe but improved with the pramipexole.  Review of Systems  Constitutional:  Positive for fatigue.  HENT:  Positive for mouth dryness. Negative for mouth sores and nose dryness.   Eyes:  Negative for pain, itching, visual disturbance and dryness.  Respiratory:  Negative for cough, hemoptysis, shortness of breath and difficulty breathing.   Cardiovascular:  Positive for palpitations. Negative for chest pain and swelling in legs/feet.  Gastrointestinal:  Positive for constipation. Negative for abdominal pain, blood in stool and diarrhea.  Endocrine: Negative for increased urination.  Genitourinary:  Negative for painful urination.  Musculoskeletal:  Positive for joint pain, joint pain, joint swelling, myalgias, muscle weakness, morning stiffness, muscle tenderness and myalgias.  Skin:  Negative for color change, rash and redness.  Allergic/Immunologic: Negative for susceptible to infections.  Neurological:  Positive for light-headedness. Negative for dizziness, numbness, headaches, memory loss and weakness.  Hematological:  Negative for swollen glands.  Psychiatric/Behavioral:  Positive for sleep disturbance. Negative for confusion.    PMFS History:  Patient Active Problem List   Diagnosis Date Noted   Age-related osteoporosis without current pathological fracture 12/04/2020   Restless leg syndrome 11/16/2020   History of breast biopsy 08/22/2020   Hypertension    Hyperlipidemia    Osteoarthritis    Anxiety  OSA on CPAP    Lesion of left external ear 01/22/2016   Recurrent major depressive disorder, in remission ([place]) 12/06/2015   Lower GI bleed 12/06/2015   Encounter for screening colonoscopy  11/21/2015   Dysphagia 11/07/2015   Cataract 06/02/2014   Epistaxis, recurrent 12/22/2013   Refusal of blood transfusions as patient is Jehovah's Witness 11/09/2013   Advance directive on file 11/09/2013   Fibromyalgia 08/26/2013    Past Medical History:  Diagnosis Date   Anxiety    Colon polyp    Hyperlipidemia    Hypertension    Methotrexate, long term, current use    OSA on CPAP    Osteoarthritis     Family History  Problem Relation Age of Onset   Hypertension Mother    AAA (abdominal aortic aneurysm) Mother    Colon polyps Father    Colon cancer Father    Hypertension Father    Prostate cancer Father    Clotting disorder Sister    Heart disease Brother    Heart disease Brother    Past Surgical History:  Procedure Laterality Date   CATARACT EXTRACTION     CHOLECYSTECTOMY     COLONOSCOPY     GASTRIC BYPASS     HERNIA REPAIR     REPLACEMENT TOTAL KNEE Right    SINUS IRRIGATION     TONSILLECTOMY     Social History   Social History Narrative   Not on file   Immunization History  Administered Date(s) Administered   Influenza,inj,Quad PF,6+ Mos 01/13/2014   Influenza-Unspecified 12/14/2016, 12/15/2018, 12/15/2019   Moderna Sars-Covid-2 Vaccination 05/21/2019, 06/18/2019, 03/07/2020, 07/13/2020   Pneumococcal Polysaccharide-23 03/24/2014     Objective: Vital Signs: BP 115/83 (BP Location: Left Arm, Patient Position: Sitting, Cuff Size: Normal)   Pulse 80   Ht '5\' 1"'  (1.549 m)   Wt 134 lb 3.2 oz (60.9 kg)   BMI 25.36 kg/m    Physical Exam Skin:    General: Skin is warm and dry.     Findings: No rash.  Neurological:     Mental Status: She is alert.  Psychiatric:        Mood and Affect: Mood normal.     Musculoskeletal Exam:  Shoulders full ROM no tenderness or swelling Elbows full ROM no tenderness or swelling Wrists full ROM mild tenderness no swelling or warmth Fingers extensive DIP heberdon's nodes with deviation of joints and decreased ROM,  bilateral 1st CMC joint squaring right worse than left and with MCP joint hyperextension  Investigation: No additional findings.  Imaging: XR Foot Complete Left  Result Date: 11/17/2020 AP, oblique, lateral views of left foot reviewed.  Distal second and third metatarsal fractures again noted with surrounding callus but continued fracture line that is visible.  No new fracture or dislocation noted.   Recent Labs: Lab Results  Component Value Date   WBC 3.4 08/10/2020   HGB 12.4 08/10/2020   PLT 257 08/10/2020   NA 141 08/10/2020   K 3.6 08/10/2020   CL 101 08/10/2020   CO2 27 08/10/2020   GLUCOSE 97 08/10/2020   BUN 11 08/10/2020   CREATININE 0.78 08/10/2020   BILITOT 0.4 08/10/2020   ALKPHOS 135 (H) 08/10/2020   AST 15 08/10/2020   ALT 13 08/10/2020   PROT 6.5 08/10/2020   ALBUMIN 4.3 08/10/2020   CALCIUM 9.7 08/10/2020    Speciality Comments: No specialty comments available.  Procedures:  Small Joint Inj: bilateral thumb CMC on 12/04/2020 2:42 PM Indications:  pain Details: 25 G needle, radial approach Medications (Right): 0.5 mL lidocaine 1 %; 20 mg triamcinolone acetonide 40 MG/ML Medications (Left): 0.5 mL lidocaine 1 %; 20 mg triamcinolone acetonide 40 MG/ML Outcome: tolerated well, no immediate complications Consent was given by the patient. Immediately prior to procedure a time out was called to verify the correct patient, procedure, equipment, support staff and site/side marked as required. Patient was prepped and draped in the usual sterile fashion.    Allergies: Lyrica [pregabalin]   Assessment / Plan:     Visit Diagnoses: Osteoarthritis, unspecified osteoarthritis type, unspecified site - Plan: Small Joint Inj: bilateral thumb CMC  Bilateral hand OA with pain also limiting home activities such as cooking. Already working with OT, tylenol PRN only mildly helpful. Bilateral 1st CMC joint steroid injections today for symptom relief. She has not had injection  here before, reports past knee shots lasted 6 months benefit and past back shots lasted only 2 weeks. Written order provided for [facility] joint braces as well.  Nausea - Plan: ondansetron (ZOFRAN) 4 MG tablet  Reorder for chronic nausea PRN treatment. She has dysphagia but seems at least partly medication related.  Age-related osteoporosis without current pathological fracture  Osteoporosis and with generalized OA and hand probable erosive OA would benefit in particular with correction. Not a candidate for oral boisphosphonates due to prior gastric bypass surgery. Discussed treatment options plan to start denosumab. Discussed risks and benefits including atypical femoral fracture and osteonecrosis of the jaw.  Orders: Orders Placed This Encounter  Procedures   Small Joint Inj: bilateral thumb CMC    Meds ordered this encounter  Medications   ondansetron (ZOFRAN) 4 MG tablet    Sig: Take 1 tablet (4 mg total) by mouth daily as needed.    Dispense:  20 tablet    Refill:  0      Follow-Up Instructions: Return in about 3 months (around 03/06/2021) for OP+OA injections and prolia f/u 24mo.   CCollier Salina MD  Note - This record has been created using [employer]  Chart creation errors have been sought, but may not always  have been located. Such creation errors do not reflect on  the standard of medical care.

## 2020-12-04 ENCOUNTER — Ambulatory Visit (INDEPENDENT_AMBULATORY_CARE_PROVIDER_SITE_OTHER): Payer: Medicare Other | Admitting: Internal Medicine

## 2020-12-04 ENCOUNTER — Other Ambulatory Visit: Payer: Self-pay

## 2020-12-04 ENCOUNTER — Telehealth: Payer: Self-pay | Admitting: Family Medicine

## 2020-12-04 ENCOUNTER — Encounter: Payer: Self-pay | Admitting: Internal Medicine

## 2020-12-04 VITALS — BP 115/83 | HR 80 | Ht 61.0 in | Wt 134.2 lb

## 2020-12-04 DIAGNOSIS — M797 Fibromyalgia: Secondary | ICD-10-CM

## 2020-12-04 DIAGNOSIS — M1990 Unspecified osteoarthritis, unspecified site: Secondary | ICD-10-CM

## 2020-12-04 DIAGNOSIS — M810 Age-related osteoporosis without current pathological fracture: Secondary | ICD-10-CM | POA: Insufficient documentation

## 2020-12-04 DIAGNOSIS — R110 Nausea: Secondary | ICD-10-CM

## 2020-12-04 MED ORDER — ONDANSETRON HCL 4 MG PO TABS: 4.0000 mg | ORAL_TABLET | Freq: Every day | ORAL | 0 refills | Status: DC | PRN

## 2020-12-04 MED ORDER — LIDOCAINE HCL 1 % IJ SOLN
0.5000 mL | INTRAMUSCULAR | Status: AC | PRN
  Administered 2020-12-04: .5 mL

## 2020-12-04 MED ORDER — TRIAMCINOLONE ACETONIDE 40 MG/ML IJ SUSP
20.0000 mg | INTRAMUSCULAR | Status: AC | PRN
  Administered 2020-12-04: 20 mg via INTRA_ARTICULAR

## 2020-12-04 NOTE — Telephone Encounter (Signed)
Patient left voice mail She states she needs supplies for her CPAP    And she also wants to know who supplies   CMC braces for thumbs  Please call

## 2020-12-05 ENCOUNTER — Ambulatory Visit: Payer: Medicare Other | Admitting: Occupational Therapy

## 2020-12-05 ENCOUNTER — Other Ambulatory Visit (HOSPITAL_COMMUNITY): Payer: Self-pay

## 2020-12-05 ENCOUNTER — Telehealth: Payer: Self-pay | Admitting: Pharmacist

## 2020-12-05 DIAGNOSIS — Z79899 Other long term (current) drug therapy: Secondary | ICD-10-CM

## 2020-12-05 DIAGNOSIS — Z5181 Encounter for therapeutic drug level monitoring: Secondary | ICD-10-CM

## 2020-12-05 NOTE — Telephone Encounter (Addendum)
Please start Prolia BIV through medical and pharmacy benefit.  Prolia - [id number], [id number]  Dose: 60mg  every 6 months  Dx: Age-related osteoporosis (M80.0)  ----- Message from , MD sent at 12/04/2020  5:09 PM EDT ----- Regarding: Prolia Start Recommend starting prolia treatment for age related osteoporosis. She is not a candidate for oral treatment with dysphagia and prior bypass surgery.

## 2020-12-05 NOTE — Telephone Encounter (Signed)
Left message for pt to call me back 

## 2020-12-05 NOTE — Telephone Encounter (Signed)
Patient has Medicare parts A, B, D, and G.  No Prior Berkley Harvey is required for Prolia through the pharmacy benefits, however test claim reveals that medication cannot be filled through [facility] and patient would be locked in to an external pharmacy (presumably [facility]).  Medicare covers 80% of the injection and no authorization is required, and the patient's [employer] supplement would cover the 20% of the cost that was not paid for by Medicare as long as Medicare covered the medication. The Supplement also covers the patient's Medicare deductible.

## 2020-12-06 ENCOUNTER — Other Ambulatory Visit: Payer: Self-pay | Admitting: Internal Medicine

## 2020-12-06 ENCOUNTER — Encounter: Payer: Self-pay | Admitting: Family Medicine

## 2020-12-06 ENCOUNTER — Ambulatory Visit (INDEPENDENT_AMBULATORY_CARE_PROVIDER_SITE_OTHER): Payer: Medicare Other | Admitting: Medical

## 2020-12-06 ENCOUNTER — Other Ambulatory Visit: Payer: Self-pay

## 2020-12-06 ENCOUNTER — Encounter: Payer: Self-pay | Admitting: Medical

## 2020-12-06 VITALS — BP 124/84 | HR 77 | Wt 133.0 lb

## 2020-12-06 DIAGNOSIS — M1990 Unspecified osteoarthritis, unspecified site: Secondary | ICD-10-CM | POA: Diagnosis not present

## 2020-12-06 DIAGNOSIS — Z9989 Dependence on other enabling machines and devices: Secondary | ICD-10-CM | POA: Diagnosis not present

## 2020-12-06 DIAGNOSIS — F419 Anxiety disorder, unspecified: Secondary | ICD-10-CM

## 2020-12-06 DIAGNOSIS — M810 Age-related osteoporosis without current pathological fracture: Secondary | ICD-10-CM | POA: Diagnosis not present

## 2020-12-06 DIAGNOSIS — S8011XA Contusion of right lower leg, initial encounter: Secondary | ICD-10-CM

## 2020-12-06 DIAGNOSIS — G4733 Obstructive sleep apnea (adult) (pediatric): Secondary | ICD-10-CM | POA: Diagnosis not present

## 2020-12-06 DIAGNOSIS — S99922D Unspecified injury of left foot, subsequent encounter: Secondary | ICD-10-CM

## 2020-12-06 NOTE — Progress Notes (Signed)
Subjective:  Diana [name] is a 65 y.o. female who presents for Chief Complaint  Patient presents with   needs cpap supplies    Needs cpap supplies. Had sleep study in 2019     Here today for concerns about CPAP.  She normally sees Microbiologist here who is on medical leave.   Medical team: Hetty Blend, NP Dr. Sheliah Hatch, rheumatology Dr. Rise Paganini, orthopedics Dr. Tressia Danas, gastroenterology Dr. Genia Del, gynecology Occupational Therapy Dr. Kendall Flack, [facility]  Here for discussion of CPAP.  She has history of sleep apnea on CPAP currently.  She needs a new order for supplies through [facility].  She had a sleep study last in 2019 which she has with her today.  She feels like she does well on the CPAP.  If she actually misses nights that she wakes up gasping.  She does find a great benefit with the device.  She mainly needs supplies as she already has a machine.  She sees rheumatology and orthopedics.  She recently had some injection in her thumb due to ongoing pain and degenerative changes.  She is waiting on a special hand brace/wrap to help with the pain and arthritis.  She notes a history of osteoporosis.  She cannot use certain medications given her history of gastric bypass and intolerance.  She already failed Fosamax in the past.  Her orthopedic is handling trying to get Prolia covered by insurance.  She sees psychiatry who recently took her off trazodone and lowered her Seroquel dose in light of her sleep apnea  She notes 2 recent injuries.  She has seen orthopedics for this.  A concrete planter rolled back and hit her right lower leg about a week or so ago.  3 weeks ago she stepped off of a concrete pad injuring her left foot and broke some toes.  She is getting specific therapy through home health and orthopedics for this  No other aggravating or relieving factors.    No other c/o.  Past Medical History:   Diagnosis Date   Anxiety    Colon polyp    Fibromyalgia    Hyperlipidemia    Hypertension    Methotrexate, long term, current use    OSA on CPAP    Osteoarthritis    Current Outpatient Medications on File Prior to Visit  Medication Sig Dispense Refill   acetaminophen (TYLENOL) 500 MG tablet Take 1,000 mg by mouth 2 (two) times daily.     B Complex Vitamins (B COMPLEX 100 PO) Take 1 tablet by mouth daily.     bisacodyl (DULCOLAX) 5 MG EC tablet Take 5 mg by mouth daily as needed for moderate constipation.     buPROPion (WELLBUTRIN XL) 300 MG 24 hr tablet Take 1 tablet by mouth every morning.     busPIRone (BUSPAR) 10 MG tablet Take 10 mg by mouth daily.     calcium citrate-vitamin D (CITRACAL+D) 315-200 MG-UNIT tablet Take 1 tablet by mouth 2 (two) times daily.     Cetirizine HCl 10 MG CAPS Take 10 mg by mouth daily.     Cholecalciferol (VITAMIN D3) 50 MCG (2000 UT) CAPS Take by mouth.     escitalopram (LEXAPRO) 10 MG tablet Take 10 mg by mouth daily.     Ferrous Sulfate (IRON PO) Take 65 mg by mouth daily.     Folic Acid (FOLATE PO) Take 1 mg by mouth daily.     hydrochlorothiazide (HYDRODIURIL) 12.5 MG tablet Take  1 tablet (12.5 mg total) by mouth daily. 90 tablet 3   ipratropium (ATROVENT) 0.03 % nasal spray Place 2 sprays into the nose 2 (two) times daily. 30 mL 5   LORazepam (ATIVAN) 1 MG tablet Take 1 mg by mouth 3 (three) times daily as needed.     losartan (COZAAR) 50 MG tablet Take 1 tablet (50 mg total) by mouth daily. 30 tablet 2   Magnesium 250 MG TABS Take by mouth.     Multiple Vitamins-Minerals (ONE-A-DAY WOMENS 50+ PO) Take by mouth.     Omega-3 Fatty Acids (FISH OIL) 1000 MG CPDR Take 1 capsule by mouth daily.     ondansetron (ZOFRAN) 4 MG tablet Take 1 tablet (4 mg total) by mouth daily as needed. 20 tablet 0   pantoprazole (PROTONIX) 20 MG tablet Take 1 tablet (20 mg total) by mouth daily. 30 tablet 2   Potassium 99 MG TABS Take 1 tablet by mouth once.      QUEtiapine (SEROQUEL) 100 MG tablet Take 100 mg by mouth at bedtime.     vitamin C (ASCORBIC ACID) 500 MG tablet Take 500 mg by mouth daily.     INSULIN SYRINGE 1CC/29G 29G X 1/2" 1 ML MISC Inject into the skin as directed. (Patient not taking: No sig reported)     No current facility-administered medications on file prior to visit.     The following portions of the patient's history were reviewed and updated as appropriate: allergies, current medications, past family history, past medical history, past social history, past surgical history and problem list.  ROS Otherwise as in subjective above  Objective: BP 124/84   Pulse 77   Wt 133 lb (60.3 kg)   SpO2 99%   BMI 25.13 kg/m   General appearance: alert, no distress, well developed, well nourished, somewhat frail white female Oral: No enlarged tonsils, relatively small airway Heart: RRR, normal S1, S2, no murmurs Lungs: CTA bilaterally, no wheezes, rhonchi, or rales Pulses: 2+ radial pulses, 2+ pedal pulses, normal cap refill Right lower leg with fading purplish bruising of the distal half of the lower leg, no calf tenderness or swelling.  Negative Homans. Hammertoes present of second and third toes bilaterally. Tender left metatarsals    Assessment: Encounter Diagnoses  Name Primary?   OSA on CPAP Yes   Age related osteoporosis, unspecified pathological fracture presence    Osteoarthritis, unspecified osteoarthritis type, unspecified site    Anxiety    Contusion of right leg, initial encounter    Foot injury, left, subsequent encounter      Plan: OSA on CPAP-I reviewed her 2019 sleep study.  We discussed the findings and potential limitations.  She does get benefit with the CPAP.  We will send an order for updated CPAP supplies through [facility].  Osteoporosis-she is seeing orthopedics and rheumatology.  They are trying to get Prolia coverage thru insurance currently  Osteoarthritis-awaiting brace for her thumbs, sees  Ortho rheumatology and occupational therapy  Contusion of right leg improving.  Foot injury-getting specific therapy at home through orthopedics.  Managed by orthopedics  Anxiety - sees psychiatry.  She notes the trazodone was recently stopped and dose was cervical decreased after a medication review in light of her sleep apnea.  Kloie was seen today for needs cpap supplies.  Diagnoses and all orders for this visit:  OSA on CPAP  Age related osteoporosis, unspecified pathological fracture presence  Osteoarthritis, unspecified osteoarthritis type, unspecified site  Anxiety  Contusion of right  leg, initial encounter  Foot injury, left, subsequent encounter   Follow up: pending call back

## 2020-12-11 ENCOUNTER — Other Ambulatory Visit: Payer: Self-pay

## 2020-12-11 ENCOUNTER — Ambulatory Visit (INDEPENDENT_AMBULATORY_CARE_PROVIDER_SITE_OTHER): Payer: Medicare Other | Admitting: Medical

## 2020-12-11 VITALS — BP 110/70 | HR 69 | Wt 132.6 lb

## 2020-12-11 DIAGNOSIS — M1990 Unspecified osteoarthritis, unspecified site: Secondary | ICD-10-CM | POA: Diagnosis not present

## 2020-12-11 DIAGNOSIS — Z23 Encounter for immunization: Secondary | ICD-10-CM

## 2020-12-11 DIAGNOSIS — S63502A Unspecified sprain of left wrist, initial encounter: Secondary | ICD-10-CM

## 2020-12-11 NOTE — Progress Notes (Signed)
Subjective:  Diana [name] is a 65 y.o. female who presents for Chief Complaint  Patient presents with   left wrist sprained    At the beach slipped and twisted left wrist.      Here for left wrist injury.  She has history of osteoarthritis.  She has occupational therapy that she is doing regularly and has plans to do that tomorrow.  She was at the beach this weekend and 2 days ago she was walking downstairs to go out onto the sand at the beach and she started to slip.  She did not actually fall to the ground but her right hand was holding the stair rail and she lost her balance a little.  She had bags in items in her left hand.  When she lost her balance slightly things in her left hand shifted.  Since then she has had discomfort in her left hand.  Nothing struck her hand hard causing significant trauma.  She did not fall to the ground or land on her wrist.  She notes that her legs are sore from walking in the sand which she has not done in a while.  She had not been to the beach in 5 years.  Over the weekend she used little ice.  She also used an oral Tylenol.  No other aggravating or relieving factors.    No other c/o.  The following portions of the patient's history were reviewed and updated as appropriate: allergies, current medications, past family history, past medical history, past social history, past surgical history and problem list.  ROS Otherwise as in subjective above  Objective: BP 110/70   Pulse 69   Wt 132 lb 9.6 oz (60.1 kg)   BMI 25.05 kg/m   General appearance: alert, no distress, well developed, well nourished Diffuse bony arthritic changes of hands at MCPs, DIPs, PIPs, thumb base as well.  Deformity of the thumbs from arthritis.  She is tender over her left distal forearm and wrist somewhat midline.  More so along the radial side.  She notes mild pain with resisted wrist extension.  Otherwise nontender to palpation, no obvious swelling, normal range of motion.  No  tenderness over the anatomical snuffbox.  Finger range of motion normal as well.  Hand and fingers without tenderness.  Only tender over the left distal forearm and radial side of wrist Arms and hands neurovascularly intact.   Assessment: Encounter Diagnoses  Name Primary?   Sprain of left wrist, initial encounter Yes   Osteoarthritis, unspecified osteoarthritis type, unspecified site    Needs flu shot      Plan: We discussed her symptoms and exam findings.  The mechanism of injury was such that I would strongly doubt a fracture.  Her exam and symptoms suggest mild sprain.  Advise reinforced wrist splint over-the-counter for the next 7 to 10 days.  We discussed gentle stretching.  Advised cool therapy/ice therapy 20 minutes on 20 minutes off for the next few days.  Continue Tylenol as needed.  Advise she continue plan to see occupational therapy for her right hand as she has been doing, but to rest the left wrist for the next several days.  Counseled on the influenza virus vaccine.  Vaccine information sheet given.   High dose Influenza vaccine given after consent obtained.   Diana [name] was seen today for left wrist sprained.  Diagnoses and all orders for this visit:  Sprain of left wrist, initial encounter  Osteoarthritis, unspecified osteoarthritis type, unspecified site  Needs flu shot -     Flu Vaccine QUAD High Dose(Fluad)   Follow up: call report in a week.  If still having considerable pain in the left wrist at that time we will send for x-ray

## 2020-12-12 ENCOUNTER — Encounter: Payer: Self-pay | Admitting: Occupational Therapy

## 2020-12-12 ENCOUNTER — Ambulatory Visit: Payer: Medicare Other | Admitting: Occupational Therapy

## 2020-12-12 DIAGNOSIS — M25642 Stiffness of left hand, not elsewhere classified: Secondary | ICD-10-CM

## 2020-12-12 DIAGNOSIS — M25542 Pain in joints of left hand: Secondary | ICD-10-CM

## 2020-12-12 DIAGNOSIS — R278 Other lack of coordination: Secondary | ICD-10-CM

## 2020-12-12 DIAGNOSIS — M25641 Stiffness of right hand, not elsewhere classified: Secondary | ICD-10-CM

## 2020-12-12 DIAGNOSIS — M25541 Pain in joints of right hand: Secondary | ICD-10-CM

## 2020-12-12 DIAGNOSIS — M6281 Muscle weakness (generalized): Secondary | ICD-10-CM

## 2020-12-12 NOTE — Therapy (Signed)
[facility] [address] [place], [place], [address] Phone: [phone]   Fax:  [phone]  Occupational Therapy Treatment  Patient Details  Name: Diana [name] MRN: [id number] Date of Birth: 1955/09/15 Referring Provider (OT): Sheliah Hatch, MD   Encounter Date: 12/12/2020   OT End of Session - 12/12/20 1322     Visit Number 2    Number of Visits 9    Date for OT Re-Evaluation 02/06/21   8 visits over 10 weeks d/t scheduling conflicts that may arise   Authorization Type Medicare and [employer]etna Supplement    Authorization Time Period Covered 100%, Follow Medicare Guidelines    Authorization - Visit Number 1    Progress Note Due on Visit 10    OT Start Time 1319    OT Stop Time 1400    OT Time Calculation (min) 41 min    Activity Tolerance Patient tolerated treatment well    Behavior During Therapy WFL for tasks assessed/performed             Past Medical History:  Diagnosis Date   Anxiety    Colon polyp    Fibromyalgia    Hyperlipidemia    Hypertension    Methotrexate, long term, current use    OSA on CPAP    Osteoarthritis     Past Surgical History:  Procedure Laterality Date   CATARACT EXTRACTION     CHOLECYSTECTOMY     COLONOSCOPY     GASTRIC BYPASS     HERNIA REPAIR     REPLACEMENT TOTAL KNEE Right    SINUS IRRIGATION     TONSILLECTOMY      There were no vitals filed for this visit.   Subjective Assessment - 12/12/20 1321     Subjective  I had a fall while I was at the beach and I have a sprain right here (LUE dorsal wrist)    Pertinent History Osteoarthritis, seronegative rheumatoid arthritis, HTN, HLD, Anxiety, sleep apnea    Patient Stated Goals "maintain what I currently have as it is probably going to get worse."    Currently in Pain? No/denies                       Picking up 1 inch blocks BUE with emphasis on "C" shape with CMC joint.   Flipping cards,  Dealing cards with thumb - BUE           OT Education - 12/12/20 1413     Education Details joint protection strategies, CMC joint iso and AROM Access Code: [email or web address], issued CMC braces BUE    Person(s) Educated Patient    Methods Explanation;Demonstration;Handout    Comprehension Verbalized understanding;Returned demonstration;Need further instruction              OT Short Term Goals - 12/12/20 1327       OT SHORT TERM GOAL #1   Title Pt will verbalize understanding of joint protection strategies    Time 4    Period Weeks    Status New    Target Date 12/26/20      OT SHORT TERM GOAL #2   Title Pt will be independent with grip strength and coordination HEP    Time 4    Period Weeks    Status New      OT SHORT TERM GOAL #3   Title Pt will verbalize understanding of pain management strategies    Time 4  Period Weeks    Status New      OT SHORT TERM GOAL #4   Title Pt will be independent with wear and care of any splints and/or braces for BUE PRN    Time 4    Period Weeks    Status On-going   issued cmc thumb and cmc wrist brace BUE              OT Long Term Goals - 11/28/20 1143       OT LONG TERM GOAL #1   Title Pt will be independent with any updated HEPs    Time 10    Period Weeks    Status New    Target Date 02/06/21      OT LONG TERM GOAL #2   Title Pt will increase grip strength, bilaterally, to 30 lbs or greater to increase functional use of BUE.    Baseline R 24.9 lbs, L 29.3 lbs    Time 10    Period Weeks    Status New      OT LONG TERM GOAL #3   Title Pt will increase write a paragraph of 5-7 sentences with pain no greater than 6/10 and adapted strategies PRN    Time 10    Period Weeks    Status New      OT LONG TERM GOAL #4   Title Pt will verbalize understanding of adapted strategies for increasing ease and decreasing pain with ADLs and IADLs (cutting vegetables, opening containers (jar, mouthwash), shuffling cards, etc)     Time 10    Period Weeks    Status New                   Plan - 12/12/20 1433     Clinical Impression Statement Pt verbalized understanding of goals. Pt issued CMC braces for BUE. Pt progressing towards goals.    OT Occupational Profile and History Problem Focused Assessment - Including review of records relating to presenting problem    Occupational performance deficits (Please refer to evaluation for details): ADL's;IADL's;Leisure    Body Structure / Function / Physical Skills UE functional use;Strength;Decreased knowledge of use of DME;ADL;FMC;Flexibility;Coordination;ROM;IADL;Pain;GMC;Dexterity    Rehab Potential Good    Clinical Decision Making Limited treatment options, no task modification necessary    Comorbidities Affecting Occupational Performance: None    Modification or Assistance to Complete Evaluation  No modification of tasks or assist necessary to complete eval    OT Frequency 1x / week    OT Duration Other (comment)   8 visits over 10 weeks d/t any scheduling conflicts that may come up   OT Treatment/Interventions Moist Heat;Fluidtherapy;Self-care/ADL training;Contrast Bath;Cryotherapy;Energy conservation;Therapeutic exercise;Ultrasound;Paraffin;Patient/family education;Passive range of motion;Therapeutic activities;Splinting;DME and/or AE instruction    Plan review HEP for [facility] exercises, see how they are going, possibly theraputty (yellow?)    OT Home Exercise Plan CMC, joint protection    Consulted and Agree with Plan of Care Patient             Patient will benefit from skilled therapeutic intervention in order to improve the following deficits and impairments:   Body Structure / Function / Physical Skills: UE functional use, Strength, Decreased knowledge of use of DME, ADL, FMC, Flexibility, Coordination, ROM, IADL, Pain, GMC, Dexterity       Visit Diagnosis: Pain in joint of left hand  Pain in joint of right hand  Stiffness of left hand, not  elsewhere classified  Stiffness of right hand,  not elsewhere classified  Muscle weakness (generalized)  Other lack of coordination    Problem List Patient Active Problem List   Diagnosis Date Noted   Contusion of right leg, initial encounter 12/06/2020   Age related osteoporosis 12/06/2020   Foot injury, left, subsequent encounter 12/06/2020   Age-related osteoporosis without current pathological fracture 12/04/2020   Restless leg syndrome 11/16/2020   History of breast biopsy 08/22/2020   Hypertension    Hyperlipidemia    Osteoarthritis    Anxiety    OSA on CPAP    Lesion of left external ear 01/22/2016   Recurrent major depressive disorder, in remission (HCC) 12/06/2015   Lower GI bleed 12/06/2015   Encounter for screening colonoscopy 11/21/2015   Dysphagia 11/07/2015   Cataract 06/02/2014   Epistaxis, recurrent 12/22/2013   Refusal of blood transfusions as patient is Jehovah's Witness 11/09/2013   Advance directive on file 11/09/2013   Fibromyalgia 08/26/2013    Junious Dresser MOT, OTR/L   12/12/2020, 2:34 PM  [facility] [address] [place], [place], [address] Phone: [phone]   Fax:  [phone]  Name: Chandi Nicklin MRN: [id number] Date of Birth: 06/09/1955

## 2020-12-12 NOTE — Patient Instructions (Addendum)
Joint Protection (Grip)    Avoid: grasping thin utensils for prolonged periods. Solution: Hold thick-handled tools in dagger fashion when-ever possible for performing tasks such as stirring or scrub-bing. Relax fingers every 10 minutes during activity.  Joint Protection (Lifting)    Avoid picking up heavy items with one hand. Solution: Use both hands, and slide item whenever possible.  Joint Protection (Use Large Joints)    Avoid placing pressure on fingertips. Solution: Transfer work to other parts of body which are not affected or which have greater strength. Using body weight to push heavy doors open is an example.  Joint Protection (Carrying)    Avoid carrying items with weight on fingers. Solution: Use a shoulder bag or a back pack.  Joint Protection (Ulnar Deviation)    Avoid positions that cause fingers to lean sideways toward little finger. Solution: Use devices like jar-openers to assist in activities.  Copyright  VHI. All rights reserved.    

## 2020-12-14 NOTE — Telephone Encounter (Signed)
Called patient regarding Prolia insurance findings.  She will need updated CBC and CMP prior to Prolia injection (last drawn in April 2022). Patient aware that Medical Day will not allow her to schedule if Prolia labs are outdated.  Future lab orders for CBC w diff and CMP w GFR placed today. She will plan to stop by clinic tomorrow to have drawn. Patient provided with clinic address and walk-in lab hours.  Once labs result and wnl to proceed with Prolia, will place Medical Day order. Patient verbalized understanding  Chesley Mires, PharmD, MPH, BCPS Clinical Pharmacist (Rheumatology and Pulmonology)

## 2020-12-15 ENCOUNTER — Other Ambulatory Visit: Payer: Self-pay | Admitting: *Deleted

## 2020-12-15 DIAGNOSIS — Z79899 Other long term (current) drug therapy: Secondary | ICD-10-CM

## 2020-12-15 DIAGNOSIS — Z79631 Long term (current) use of antimetabolite agent: Secondary | ICD-10-CM

## 2020-12-15 DIAGNOSIS — M810 Age-related osteoporosis without current pathological fracture: Secondary | ICD-10-CM

## 2020-12-16 LAB — COMPLETE METABOLIC PANEL WITH GFR
AG Ratio: 2 (calc) (ref 1.0–2.5)
ALT: 16 U/L (ref 6–29)
AST: 19 U/L (ref 10–35)
Albumin: 4 g/dL (ref 3.6–5.1)
Alkaline phosphatase (APISO): 74 U/L (ref 37–153)
BUN: 15 mg/dL (ref 7–25)
CO2: 30 mmol/L (ref 20–32)
Calcium: 9.4 mg/dL (ref 8.6–10.4)
Chloride: 103 mmol/L (ref 98–110)
Creat: 0.77 mg/dL (ref 0.50–1.05)
Globulin: 2 g/dL (calc) (ref 1.9–3.7)
Glucose, Bld: 111 mg/dL — ABNORMAL HIGH (ref 65–99)
Potassium: 4.2 mmol/L (ref 3.5–5.3)
Sodium: 138 mmol/L (ref 135–146)
Total Bilirubin: 0.5 mg/dL (ref 0.2–1.2)
Total Protein: 6 g/dL — ABNORMAL LOW (ref 6.1–8.1)
eGFR: 85 mL/min/{1.73_m2} (ref >=60)

## 2020-12-16 LAB — CBC WITH DIFFERENTIAL/PLATELET
Absolute Monocytes: 391 cells/uL (ref 200–950)
Basophils Absolute: 8 cells/uL (ref 0–200)
Basophils Relative: 0.2 %
Eosinophils Absolute: 130 cells/uL (ref 15–500)
Eosinophils Relative: 3.1 %
HCT: 37.8 % (ref 35.0–45.0)
Hemoglobin: 12.6 g/dL (ref 11.7–15.5)
Lymphs Abs: 1512 cells/uL (ref 850–3900)
MCH: 32.6 pg (ref 27.0–33.0)
MCHC: 33.3 g/dL (ref 32.0–36.0)
MCV: 97.7 fL (ref 80.0–100.0)
MPV: 11.1 fL (ref 7.5–12.5)
Monocytes Relative: 9.3 %
Neutro Abs: 2159 cells/uL (ref 1500–7800)
Neutrophils Relative %: 51.4 %
Platelets: 254 10*3/uL (ref 140–400)
RBC: 3.87 10*6/uL (ref 3.80–5.10)
RDW: 12.2 % (ref 11.0–15.0)
Total Lymphocyte: 36 %
WBC: 4.2 10*3/uL (ref 3.8–10.8)

## 2020-12-19 ENCOUNTER — Encounter: Payer: Self-pay | Admitting: Occupational Therapy

## 2020-12-19 ENCOUNTER — Other Ambulatory Visit: Payer: Self-pay

## 2020-12-19 ENCOUNTER — Ambulatory Visit: Payer: Medicare Other | Attending: Family Medicine | Admitting: Occupational Therapy

## 2020-12-19 DIAGNOSIS — M25541 Pain in joints of right hand: Secondary | ICD-10-CM | POA: Insufficient documentation

## 2020-12-19 DIAGNOSIS — M25542 Pain in joints of left hand: Secondary | ICD-10-CM | POA: Insufficient documentation

## 2020-12-19 DIAGNOSIS — R278 Other lack of coordination: Secondary | ICD-10-CM | POA: Diagnosis present

## 2020-12-19 DIAGNOSIS — M25642 Stiffness of left hand, not elsewhere classified: Secondary | ICD-10-CM | POA: Insufficient documentation

## 2020-12-19 DIAGNOSIS — M6281 Muscle weakness (generalized): Secondary | ICD-10-CM | POA: Diagnosis present

## 2020-12-19 DIAGNOSIS — M25641 Stiffness of right hand, not elsewhere classified: Secondary | ICD-10-CM | POA: Diagnosis present

## 2020-12-19 NOTE — Therapy (Signed)
[facility] [address] [place], [place], [address] Phone: [phone]   Fax:  [phone]  Occupational Therapy Treatment  Patient Details  Name: Diana [name] MRN: [id number] Date of Birth: [birth year] Referring Provider (OT): Sheliah Hatch, MD   Encounter Date: 12/19/2020   OT End of Session - 12/19/20 1227     Visit Number 3    Number of Visits 9    Date for OT Re-Evaluation 02/06/21   8 visits over 10 weeks d/t scheduling conflicts that may arise   Authorization Type Medicare and Aetna Supplement    Authorization Time Period Covered 100%, Follow Medicare Guidelines    Authorization - Visit Number 2    Progress Note Due on Visit 10    OT Start Time 1227    OT Stop Time 1307    OT Time Calculation (min) 40 min    Activity Tolerance Patient tolerated treatment well    Behavior During Therapy WFL for tasks assessed/performed             Past Medical History:  Diagnosis Date   Anxiety    Colon polyp    Fibromyalgia    Hyperlipidemia    Hypertension    Methotrexate, long term, current use    OSA on CPAP    Osteoarthritis     Past Surgical History:  Procedure Laterality Date   CATARACT EXTRACTION     CHOLECYSTECTOMY     COLONOSCOPY     GASTRIC BYPASS     HERNIA REPAIR     REPLACEMENT TOTAL KNEE Right    SINUS IRRIGATION     TONSILLECTOMY      There were no vitals filed for this visit.   Subjective Assessment - 12/19/20 1228     Subjective  Pt denies anything new. "With those on (braces) doesn't seem to be bad"    Pertinent History Osteoarthritis, seronegative rheumatoid arthritis, HTN, HLD, Anxiety, sleep apnea    Patient Stated Goals "maintain what I currently have as it is probably going to get worse."    Currently in Pain? Yes    Pain Score 3     Pain Location Hand    Pain Orientation Left;Right    Pain Descriptors / Indicators Aching;Squeezing    Pain Type Chronic pain    Pain  Onset More than a month ago    Pain Frequency Constant                          OT Treatments/Exercises (OP) - 12/19/20 1231       Exercises   Exercises Hand      Hand Exercises   Other Hand Exercises BUE palmar abduction x 10 reps with light rubberband      Modalities   Modalities Fluidotherapy      RUE Fluidotherapy   Number Minutes Fluidotherapy 12 Minutes    RUE Fluidotherapy Location Hand    Comments for pain and stiffness      LUE Fluidotherapy   Number Minutes Fluidotherapy 12 Minutes    LUE Fluidotherapy Location Hand;Wrist    Comments for pain and stiffness      Fine Motor Coordination (Hand/Wrist)   Fine Motor Coordination Flipping cards    Flipping cards with BUE                    OT Education - 12/19/20 1237     Education Details issued yellow theraputty Access  Code: [email or web address]    Person(s) Educated Patient    Methods Explanation;Demonstration;Handout    Comprehension Verbalized understanding;Returned demonstration;Need further instruction              OT Short Term Goals - 12/19/20 1227       OT SHORT TERM GOAL #1   Title Pt will verbalize understanding of joint protection strategies    Time 4    Period Weeks    Status On-going    Target Date 12/26/20      OT SHORT TERM GOAL #2   Title Pt will be independent with grip strength and coordination HEP    Time 4    Period Weeks    Status On-going      OT SHORT TERM GOAL #3   Title Pt will verbalize understanding of pain management strategies    Time 4    Period Weeks    Status On-going      OT SHORT TERM GOAL #4   Title Pt will be independent with wear and care of any splints and/or braces for BUE PRN    Time 4    Period Weeks    Status On-going   issued cmc thumb and cmc wrist brace BUE              OT Long Term Goals - 11/28/20 1143       OT LONG TERM GOAL #1   Title Pt will be independent with any updated HEPs    Time 10    Period Weeks     Status New    Target Date 02/06/21      OT LONG TERM GOAL #2   Title Pt will increase grip strength, bilaterally, to 30 lbs or greater to increase functional use of BUE.    Baseline R 24.9 lbs, L 29.3 lbs    Time 10    Period Weeks    Status New      OT LONG TERM GOAL #3   Title Pt will increase write a paragraph of 5-7 sentences with pain no greater than 6/10 and adapted strategies PRN    Time 10    Period Weeks    Status New      OT LONG TERM GOAL #4   Title Pt will verbalize understanding of adapted strategies for increasing ease and decreasing pain with ADLs and IADLs (cutting vegetables, opening containers (jar, mouthwash), shuffling cards, etc)    Time 10    Period Weeks    Status New                   Plan - 12/19/20 1239     Clinical Impression Statement Pt reports liking braces and reduction in pain with use of braces    OT Occupational Profile and History Problem Focused Assessment - Including review of records relating to presenting problem    Occupational performance deficits (Please refer to evaluation for details): ADL's;IADL's;Leisure    Body Structure / Function / Physical Skills UE functional use;Strength;Decreased knowledge of use of DME;ADL;FMC;Flexibility;Coordination;ROM;IADL;Pain;GMC;Dexterity    Rehab Potential Good    Clinical Decision Making Limited treatment options, no task modification necessary    Comorbidities Affecting Occupational Performance: None    Modification or Assistance to Complete Evaluation  No modification of tasks or assist necessary to complete eval    OT Frequency 1x / week    OT Duration Other (comment)   8 visits over 10 weeks d/t any scheduling conflicts that may  come up   OT Treatment/Interventions Moist Heat;Fluidtherapy;Self-care/ADL training;Contrast Bath;Cryotherapy;Energy conservation;Therapeutic exercise;Ultrasound;Paraffin;Patient/family education;Passive range of motion;Therapeutic activities;Splinting;DME and/or  AE instruction    Plan Fluido or Paraffin BUE, see how putty is going    OT Home Exercise Plan CMC, joint protection    Consulted and Agree with Plan of Care Patient             Patient will benefit from skilled therapeutic intervention in order to improve the following deficits and impairments:   Body Structure / Function / Physical Skills: UE functional use, Strength, Decreased knowledge of use of DME, ADL, FMC, Flexibility, Coordination, ROM, IADL, Pain, GMC, Dexterity       Visit Diagnosis: Pain in joint of left hand  Stiffness of left hand, not elsewhere classified  Pain in joint of right hand  Stiffness of right hand, not elsewhere classified  Muscle weakness (generalized)  Other lack of coordination    Problem List Patient Active Problem List   Diagnosis Date Noted   Contusion of right leg, initial encounter 12/06/2020   Age related osteoporosis 12/06/2020   Foot injury, left, subsequent encounter 12/06/2020   Age-related osteoporosis without current pathological fracture 12/04/2020   Restless leg syndrome 11/16/2020   History of breast biopsy 08/22/2020   Hypertension    Hyperlipidemia    Osteoarthritis    Anxiety    OSA on CPAP    Lesion of left external ear 01/22/2016   Recurrent major depressive disorder, in remission (HCC) 12/06/2015   Lower GI bleed 12/06/2015   Encounter for screening colonoscopy 11/21/2015   Dysphagia 11/07/2015   Cataract 06/02/2014   Epistaxis, recurrent 12/22/2013   Refusal of blood transfusions as patient is Jehovah's Witness 11/09/2013   Advance directive on file 11/09/2013   Fibromyalgia 08/26/2013    Junious Dresser MOT, OTR/L  12/19/2020, 1:09 PM  [place] [facility] [address] [place], [place], [address] Phone: [phone]   Fax:  [phone]  Name: Diana [name] MRN: [id number] Date of Birth: 1955-11-24

## 2020-12-19 NOTE — Patient Instructions (Signed)
Access Code: [email or web address] URL: [email or web address] Date: 12/19/2020 Prepared by: Kallie Edward  Exercises Putty Squeezes - 1 x daily - 7 x weekly - 3 sets - 10 reps Rolling Putty on Table - 1 x daily - 7 x weekly - 3 sets - 10 reps Thumb Opposition with Putty - 1 x daily - 7 x weekly - 3 sets - 10 reps Seated Finger MP Flexion with Putty - 1 x daily - 7 x weekly - 3 sets - 10 reps

## 2020-12-20 ENCOUNTER — Encounter: Payer: Self-pay | Admitting: Family Medicine

## 2020-12-20 ENCOUNTER — Encounter: Payer: Self-pay | Admitting: Orthopedic Surgery

## 2020-12-20 ENCOUNTER — Ambulatory Visit (INDEPENDENT_AMBULATORY_CARE_PROVIDER_SITE_OTHER): Payer: Medicare Other

## 2020-12-20 ENCOUNTER — Ambulatory Visit (INDEPENDENT_AMBULATORY_CARE_PROVIDER_SITE_OTHER): Payer: Medicare Other | Admitting: Orthopedic Surgery

## 2020-12-20 DIAGNOSIS — M79672 Pain in left foot: Secondary | ICD-10-CM

## 2020-12-20 NOTE — Progress Notes (Signed)
Office Visit Note   Patient: Diana [name]           Date of Birth: [birth year]           MRN: [id number] Visit Date: 12/20/2020 Requested by: Avanell Shackleton, NP-C [address] [place],  [place] [address] PCP: Avanell Shackleton, NP-C  Subjective: Chief Complaint  Patient presents with   Left Foot - Follow-up    HPI: Patient presents now over 3 months out left foot metatarsal fracture #2 and 3.  Pain is decreasing.  She has been using bone stimulator for 4 weeks.  Reports overall clinical improvement.  Taking Tylenol for pain.              ROS: All systems reviewed are negative as they relate to the chief complaint within the history of present illness.  Patient denies  fevers or chills.   Assessment & Plan: Visit Diagnoses:  1. Pain in left foot     Plan: Impression is progressive healing of left foot metatarsal fractures with delayed union but that has been improved with the bone stimulator.  Plan is to continue bone stimulator for 2 more weeks and then progress with activity as tolerated.  Follow-up as needed  Follow-Up Instructions: No follow-ups on file.   Orders:  Orders Placed This Encounter  Procedures   XR Foot Complete Left   No orders of the defined types were placed in this encounter.     Procedures: No procedures performed   Clinical Data: No additional findings.  Objective: Vital Signs: There were no vitals taken for this visit.  Physical Exam:   Constitutional: Patient appears well-developed HEENT:  Head: Normocephalic Eyes:EOM are normal Neck: Normal range of motion Cardiovascular: Normal rate Pulmonary/chest: Effort normal Neurologic: Patient is alert Skin: Skin is warm Psychiatric: Patient has normal mood and affect   Ortho Exam: Ortho exam demonstrates minimal to no tenderness to palpation of the left metatarsal shaft fractures 2 and 3.  No pain with pronation supination of the forefoot.  Minimal to no swelling which is  asymmetric compared to the right-hand side.  Palpable intact nontender anterior to posterior to peroneal and Achilles tendons.  Specialty Comments:  No specialty comments available.  Imaging: XR Foot Complete Left  Result Date: 12/20/2020 AP lateral oblique radiographs of the left foot reviewed.  Metatarsal shaft fractures 2 and 3 have callus formation present with bridging bone across the fracture site.  No new fractures.  Mild degenerative changes in the midfoot.  Lesser toe deformities noted.    PMFS History: Patient Active Problem List   Diagnosis Date Noted   Contusion of right leg, initial encounter 12/06/2020   Age related osteoporosis 12/06/2020   Foot injury, left, subsequent encounter 12/06/2020   Age-related osteoporosis without current pathological fracture 12/04/2020   Restless leg syndrome 11/16/2020   History of breast biopsy 08/22/2020   Hypertension    Hyperlipidemia    Osteoarthritis    Anxiety    OSA on CPAP    Lesion of left external ear 01/22/2016   Recurrent major depressive disorder, in remission (HCC) 12/06/2015   Lower GI bleed 12/06/2015   Encounter for screening colonoscopy 11/21/2015   Dysphagia 11/07/2015   Cataract 06/02/2014   Epistaxis, recurrent 12/22/2013   Refusal of blood transfusions as patient is Jehovah's Witness 11/09/2013   Advance directive on file 11/09/2013   Fibromyalgia 08/26/2013   Past Medical History:  Diagnosis Date   Anxiety    Colon polyp  Fibromyalgia    Hyperlipidemia    Hypertension    Methotrexate, long term, current use    OSA on CPAP    Osteoarthritis     Family History  Problem Relation Age of Onset   Hypertension Mother    AAA (abdominal aortic aneurysm) Mother    Colon polyps Father    Colon cancer Father    Hypertension Father    Prostate cancer Father    Clotting disorder Sister    Heart disease Brother    Heart disease Brother     Past Surgical History:  Procedure Laterality Date   CATARACT  EXTRACTION     CHOLECYSTECTOMY     COLONOSCOPY     GASTRIC BYPASS     HERNIA REPAIR     REPLACEMENT TOTAL KNEE Right    SINUS IRRIGATION     TONSILLECTOMY     Social History   Occupational History   Not on file  Tobacco Use   Smoking status: Never   Smokeless tobacco: Never  Vaping Use   Vaping Use: Never used  Substance and Sexual Activity   Alcohol use: Never   Drug use: Never   Sexual activity: Not Currently

## 2020-12-21 ENCOUNTER — Other Ambulatory Visit: Payer: Self-pay | Admitting: Pharmacist

## 2020-12-21 DIAGNOSIS — M810 Age-related osteoporosis without current pathological fracture: Secondary | ICD-10-CM

## 2020-12-21 NOTE — Progress Notes (Signed)
Patient is newly starting Prolia and due for updated orders. Diagnosis: age-related osteoporosis  Dose: 60mg  every 6 months  Last Clinic Visit: 12/04/20 Next Clinic Visit: 03/05/21  Labs: CBC and CMP on 12/15/20 wnl, Vitamin D wnl on 11/08/20   Orders placed for Prolia SQ x 1 dose. No premeds required.  Called patient and provided with phone number for: [facility] [phone]) ([phone] on voicemail.   Will follow-up to ensured scheduled and completed  Ginette Otto, PharmD, MPH, BCPS Clinical Pharmacist (Rheumatology and Pulmonology)

## 2020-12-21 NOTE — Telephone Encounter (Signed)
Prolia order placed and patient called to advise to schedule injection at [facility]. Unable to reach patient but left detailed message on VM. Will f/u  Chesley Mires, PharmD, MPH, BCPS Clinical Pharmacist (Rheumatology and Pulmonology)

## 2020-12-22 ENCOUNTER — Telehealth: Payer: Self-pay | Admitting: *Deleted

## 2020-12-22 ENCOUNTER — Encounter: Payer: Self-pay | Admitting: *Deleted

## 2020-12-22 NOTE — Telephone Encounter (Signed)
Patient contacted the office stating she is scheduled for an injection at the hospital on 01/03/2021. Patient was unclear if it was Prolia or Reclast. Patient advised she will be receiving the Prolia. Patient expressed understanding.

## 2020-12-26 ENCOUNTER — Ambulatory Visit: Payer: Medicare Other | Admitting: Occupational Therapy

## 2020-12-26 ENCOUNTER — Other Ambulatory Visit: Payer: Self-pay

## 2020-12-26 ENCOUNTER — Encounter: Payer: Self-pay | Admitting: Occupational Therapy

## 2020-12-26 DIAGNOSIS — M25541 Pain in joints of right hand: Secondary | ICD-10-CM

## 2020-12-26 DIAGNOSIS — R278 Other lack of coordination: Secondary | ICD-10-CM

## 2020-12-26 DIAGNOSIS — M25641 Stiffness of right hand, not elsewhere classified: Secondary | ICD-10-CM

## 2020-12-26 DIAGNOSIS — M6281 Muscle weakness (generalized): Secondary | ICD-10-CM

## 2020-12-26 DIAGNOSIS — M25542 Pain in joints of left hand: Secondary | ICD-10-CM

## 2020-12-26 DIAGNOSIS — M25642 Stiffness of left hand, not elsewhere classified: Secondary | ICD-10-CM

## 2020-12-26 NOTE — Progress Notes (Signed)
Prolia scheduled for 01/03/21. Will f/u to ensure completed  Chesley Mires, PharmD, MPH, BCPS Clinical Pharmacist (Rheumatology and Pulmonology)

## 2020-12-26 NOTE — Therapy (Signed)
[facility] [address] [place], [place], [address] Phone: [phone]   Fax:  [phone]  Occupational Therapy Treatment/Goal Check  Patient Details  Name: Diana [name] MRN: [id number] Date of Birth: 07-Sep-1955 Referring Provider (OT): Sheliah Hatch, MD   Encounter Date: 12/26/2020   OT End of Session - 12/26/20 1322     Visit Number 4    Number of Visits 9    Date for OT Re-Evaluation 02/06/21   8 visits over 10 weeks d/t scheduling conflicts that may arise   Authorization Type Medicare and Aetna Supplement    Authorization Time Period Covered 100%, Follow Medicare Guidelines    Authorization - Visit Number 3    Progress Note Due on Visit 10    OT Start Time 1318    OT Stop Time 1400    OT Time Calculation (min) 42 min    Activity Tolerance Patient tolerated treatment well    Behavior During Therapy WFL for tasks assessed/performed             Past Medical History:  Diagnosis Date   Anxiety    Colon polyp    Fibromyalgia    Hyperlipidemia    Hypertension    Methotrexate, long term, current use    OSA on CPAP    Osteoarthritis     Past Surgical History:  Procedure Laterality Date   CATARACT EXTRACTION     CHOLECYSTECTOMY     COLONOSCOPY     GASTRIC BYPASS     HERNIA REPAIR     REPLACEMENT TOTAL KNEE Right    SINUS IRRIGATION     TONSILLECTOMY      There were no vitals filed for this visit.   Subjective Assessment - 12/26/20 1322     Subjective  Pt reports "ever since doing the putty last week I have had a shooting pain all the way up (RUE)"    Pertinent History Osteoarthritis, seronegative rheumatoid arthritis, HTN, HLD, Anxiety, sleep apnea    Patient Stated Goals "maintain what I currently have as it is probably going to get worse."    Currently in Pain? Yes    Pain Score 5     Pain Location Hand   CMC thumb   Pain Orientation Left;Right    Pain Descriptors / Indicators  Aching    Pain Type Chronic pain    Pain Onset More than a month ago    Pain Frequency Constant    Aggravating Factors  overuse    Pain Relieving Factors braces              Reviewed Goals.  Fluidotherapy x 12 minutes for BUE to address pain, swelling, and stiffness. No adverse reactions.   Handwriting working on handwriting with RUE with use of tan/brown foam grip for increased joint protection for decreased pain. Pt wrote 3-4 sentences with good legibility and no significant increase in pain today. Pt benefited from grip and OT issued one for patient. No increase in pain after handwriting.                     OT Short Term Goals - 12/26/20 1326       OT SHORT TERM GOAL #1   Title Pt will verbalize understanding of joint protection strategies    Time 4    Period Weeks    Status Achieved   discussed shoulder bags, protecting smaller joints, reviewed joint protection strategies - pt verbalizes understanding  Target Date 12/26/20      OT SHORT TERM GOAL #2   Title Pt will be independent with grip strength and coordination HEP    Time 4    Period Weeks    Status On-going   pt has refrained from putty at this time d/t pain.     OT SHORT TERM GOAL #3   Title Pt will verbalize understanding of pain management strategies    Time 4    Period Weeks    Status Achieved      OT SHORT TERM GOAL #4   Title Pt will be independent with wear and care of any splints and/or braces for BUE PRN    Time 4    Period Weeks    Status Achieved   issued cmc thumb and cmc wrist brace BUE              OT Long Term Goals - 12/26/20 1329       OT LONG TERM GOAL #1   Title Pt will be independent with any updated HEPs    Time 10    Period Weeks    Status On-going      OT LONG TERM GOAL #2   Title Pt will increase grip strength, bilaterally, to 30 lbs or greater to increase functional use of BUE.    Baseline R 24.9 lbs, L 29.3 lbs    Time 10    Period Weeks     Status Achieved   37.2 lbs R, 42.1 lbs L     OT LONG TERM GOAL #3   Title Pt will increase write a paragraph of 5-7 sentences with pain no greater than 6/10 and adapted strategies PRN    Time 10    Period Weeks    Status On-going      OT LONG TERM GOAL #4   Title Pt will verbalize understanding of adapted strategies for increasing ease and decreasing pain with ADLs and IADLs (cutting vegetables, opening containers (jar, mouthwash), shuffling cards, etc)    Time 10    Period Weeks    Status On-going                   Plan - 12/26/20 1335     Clinical Impression Statement Pt reports increased pain after using putty last week.    OT Occupational Profile and History Problem Focused Assessment - Including review of records relating to presenting problem    Occupational performance deficits (Please refer to evaluation for details): ADL's;IADL's;Leisure    Body Structure / Function / Physical Skills UE functional use;Strength;Decreased knowledge of use of DME;ADL;FMC;Flexibility;Coordination;ROM;IADL;Pain;GMC;Dexterity    Rehab Potential Good    Clinical Decision Making Limited treatment options, no task modification necessary    Comorbidities Affecting Occupational Performance: None    Modification or Assistance to Complete Evaluation  No modification of tasks or assist necessary to complete eval    OT Frequency 1x / week    OT Duration Other (comment)   8 visits over 10 weeks d/t any scheduling conflicts that may come up   OT Treatment/Interventions Moist Heat;Fluidtherapy;Self-care/ADL training;Contrast Bath;Cryotherapy;Energy conservation;Therapeutic exercise;Ultrasound;Paraffin;Patient/family education;Passive range of motion;Therapeutic activities;Splinting;DME and/or AE instruction    Plan Fluido or Paraffin BUE, handwriting tools (adapted)    OT Home Exercise Plan CMC, joint protection    Consulted and Agree with Plan of Care Patient             Patient will benefit  from skilled therapeutic intervention in  order to improve the following deficits and impairments:   Body Structure / Function / Physical Skills: UE functional use, Strength, Decreased knowledge of use of DME, ADL, FMC, Flexibility, Coordination, ROM, IADL, Pain, GMC, Dexterity       Visit Diagnosis: Stiffness of left hand, not elsewhere classified  Pain in joint of left hand  Pain in joint of right hand  Stiffness of right hand, not elsewhere classified  Muscle weakness (generalized)  Other lack of coordination    Problem List Patient Active Problem List   Diagnosis Date Noted   Contusion of right leg, initial encounter 12/06/2020   Age related osteoporosis 12/06/2020   Foot injury, left, subsequent encounter 12/06/2020   Age-related osteoporosis without current pathological fracture 12/04/2020   Restless leg syndrome 11/16/2020   History of breast biopsy 08/22/2020   Hypertension    Hyperlipidemia    Osteoarthritis    Anxiety    OSA on CPAP    Lesion of left external ear 01/22/2016   Recurrent major depressive disorder, in remission (HCC) 12/06/2015   Lower GI bleed 12/06/2015   Encounter for screening colonoscopy 11/21/2015   Dysphagia 11/07/2015   Cataract 06/02/2014   Epistaxis, recurrent 12/22/2013   Refusal of blood transfusions as patient is Jehovah's Witness 11/09/2013   Advance directive on file 11/09/2013   Fibromyalgia 08/26/2013    Junious Dresser, OT/L 12/26/2020, 1:45 PM  [facility] [address] [place], [place], [address] Phone: [phone]   Fax:  [phone]  Name: Diana [name] MRN: [id number] Date of Birth: 1955-07-16

## 2020-12-27 ENCOUNTER — Encounter: Payer: Self-pay | Admitting: Family Medicine

## 2020-12-27 ENCOUNTER — Telehealth: Payer: Self-pay | Admitting: Family Medicine

## 2020-12-27 ENCOUNTER — Ambulatory Visit (INDEPENDENT_AMBULATORY_CARE_PROVIDER_SITE_OTHER): Payer: Medicare Other | Admitting: Family Medicine

## 2020-12-27 VITALS — BP 124/84 | HR 80 | Temp 97.8°F | Wt 131.2 lb

## 2020-12-27 DIAGNOSIS — R7309 Other abnormal glucose: Secondary | ICD-10-CM | POA: Diagnosis not present

## 2020-12-27 LAB — POCT GLYCOSYLATED HEMOGLOBIN (HGB A1C): Hemoglobin A1C: 5.4 % (ref 4.0–5.6)

## 2020-12-27 NOTE — Telephone Encounter (Signed)
Fax refill request for Losartan 50 mg #30   last refill 09/29/20

## 2020-12-27 NOTE — Progress Notes (Signed)
   Subjective:    Patient ID: Diana [name], female    DOB: 1955/08/07, 65 y.o.   MRN: [id number]  HPI Chief Complaint  Patient presents with   other    F/u on labs x-ray at chiropractor maybe fracture in her neck    She is here due to having a random glucose of 111 at a different practice.  States she was told she may have prediabetes and that she should get this checked. Denies personal or family history of diabetes. Overall her diet is fairly healthy.  She is active.  Denies blurred vision, polydysuria, polyuria.  She also reports that she saw her chiropractor and had an x-ray of her cervical spine.  The chiropractor recommended follow-up with me or her orthopedist. States she plans to see her orthopedist, Dr. August Saucer.  She also has on bilateral wrist splints for CTS.  She is seeing her occupational therapist for this.   Review of Systems Pertinent positives and negatives in the history of present illness.     Objective:   Physical Exam BP 124/84   Pulse 80   Temp 97.8 F (36.6 C)   Wt 131 lb 3.2 oz (59.5 kg)   BMI 24.79 kg/m   Alert and oriented and in no acute distress.       Assessment & Plan:  Elevated random blood glucose level - Plan: HgB A1c  Normal Hgb A1c 5.4%. explained that she does not have prediabtes.  Continue taking good care of herself and call Dr. August Saucer, her orthopedist to discuss her orthopedic concerns today.

## 2020-12-28 ENCOUNTER — Other Ambulatory Visit: Payer: Self-pay

## 2020-12-28 ENCOUNTER — Telehealth: Payer: Self-pay | Admitting: Family Medicine

## 2020-12-28 ENCOUNTER — Telehealth: Payer: Self-pay | Admitting: Orthopedic Surgery

## 2020-12-28 DIAGNOSIS — M542 Cervicalgia: Secondary | ICD-10-CM

## 2020-12-28 MED ORDER — LOSARTAN POTASSIUM 50 MG PO TABS: 50.0000 mg | ORAL_TABLET | Freq: Every day | ORAL | 2 refills | Status: DC

## 2020-12-28 MED ORDER — PANTOPRAZOLE SODIUM 20 MG PO TBEC: 20.0000 mg | DELAYED_RELEASE_TABLET | Freq: Every day | ORAL | 2 refills | Status: DC

## 2020-12-28 NOTE — Addendum Note (Signed)
Addended byPrescott Parma on: 12/28/2020 01:09 PM   Modules accepted: Orders

## 2020-12-28 NOTE — Telephone Encounter (Signed)
Ordered scan

## 2020-12-28 NOTE — Telephone Encounter (Signed)
Received fax from [facility] on [address] requesting refills. Please send Pantoprazole 20 mg and Losartan 50mg .

## 2020-12-28 NOTE — Telephone Encounter (Signed)
I did talk with this patient.  She had a fall and whiplash type injury 3 weeks ago.  Went to the chiropractor.  I reviewed the radiographs and there is concern for a C3 facet fracture.  No neurological symptoms in either arm but there is some abnormality in the C3 vertebral body which is not clearly elucidated on the plain radiographs.  Advised her not to get any more adjustments and recommend CT scan of the neck to evaluate upper cervical spine fracture versus degenerative process.  Please order CT scan neck to evaluate upper cervical spine fracture.

## 2020-12-28 NOTE — Telephone Encounter (Signed)
Pt called asking dean has had time to review her xrays?   CB [phone]

## 2020-12-29 ENCOUNTER — Ambulatory Visit: Payer: Medicare Other | Admitting: Orthopedic Surgery

## 2020-12-30 ENCOUNTER — Ambulatory Visit
Admission: RE | Admit: 2020-12-30 | Discharge: 2020-12-30 | Disposition: A | Discharge status: Home / Self Care | Payer: Medicare Other | Source: Ambulatory Visit | Attending: Orthopedic Surgery | Admitting: Orthopedic Surgery

## 2020-12-30 DIAGNOSIS — M542 Cervicalgia: Secondary | ICD-10-CM

## 2020-12-31 ENCOUNTER — Encounter: Payer: Self-pay | Admitting: Orthopedic Surgery

## 2020-12-31 NOTE — Progress Notes (Signed)
Hi Diana [name].  Scan negative for fracture.  Can you call to inform her of same.  Thanks

## 2021-01-01 ENCOUNTER — Telehealth: Payer: Self-pay | Admitting: Orthopedic Surgery

## 2021-01-01 NOTE — Telephone Encounter (Signed)
Waiting for Dr August Saucer to advise per mychart note

## 2021-01-01 NOTE — Telephone Encounter (Signed)
Would caution against adjustment right now as there's remote but elevated risk of arterial dissection with manipulation of cervical spine following even minor trauma

## 2021-01-01 NOTE — Telephone Encounter (Signed)
Patient called. She would like a referral to see a chiropractor. Her call back number is [phone]

## 2021-01-02 ENCOUNTER — Other Ambulatory Visit: Payer: Self-pay

## 2021-01-02 ENCOUNTER — Ambulatory Visit: Payer: Medicare Other | Admitting: Occupational Therapy

## 2021-01-02 DIAGNOSIS — M25542 Pain in joints of left hand: Secondary | ICD-10-CM | POA: Diagnosis not present

## 2021-01-02 DIAGNOSIS — M25641 Stiffness of right hand, not elsewhere classified: Secondary | ICD-10-CM

## 2021-01-02 DIAGNOSIS — M6281 Muscle weakness (generalized): Secondary | ICD-10-CM

## 2021-01-02 DIAGNOSIS — M25541 Pain in joints of right hand: Secondary | ICD-10-CM

## 2021-01-02 DIAGNOSIS — M25642 Stiffness of left hand, not elsewhere classified: Secondary | ICD-10-CM

## 2021-01-02 DIAGNOSIS — R278 Other lack of coordination: Secondary | ICD-10-CM

## 2021-01-02 NOTE — Telephone Encounter (Signed)
Did send response to patient via mychart

## 2021-01-02 NOTE — Therapy (Signed)
[facility] [address] [place], [place], [address] Phone: [phone]   Fax:  [phone]  Occupational Therapy Treatment & Discharge  Patient Details  Name: Diana [name] MRN: [id number] Date of Birth: 1956-04-15 Referring Provider (OT): Vernelle Emerald, MD   Encounter Date: 01/02/2021   OT End of Session - 01/02/21 1236     Visit Number 5    Number of Visits 9    Date for OT Re-Evaluation 02/06/21   8 visits over 10 weeks d/t scheduling conflicts that may arise   Authorization Type Medicare and [employer] Supplement    Authorization Time Period Covered 100%, Follow Medicare Guidelines    Authorization - Visit Number 4    Progress Note Due on Visit 10    OT Start Time 1230    OT Stop Time 1250    OT Time Calculation (min) 20 min    Activity Tolerance Patient tolerated treatment well    Behavior During Therapy [facility] for tasks assessed/performed             Past Medical History:  Diagnosis Date   Anxiety    Colon polyp    Fibromyalgia    Hyperlipidemia    Hypertension    Methotrexate, long term, current use    OSA on CPAP    Osteoarthritis     Past Surgical History:  Procedure Laterality Date   CATARACT EXTRACTION     CHOLECYSTECTOMY     COLONOSCOPY     GASTRIC BYPASS     HERNIA REPAIR     REPLACEMENT TOTAL KNEE Right    SINUS IRRIGATION     TONSILLECTOMY      There were no vitals filed for this visit.   Subjective Assessment - 01/02/21 1234     Subjective  I went to see the chiropractor and he took x-rays and he thinks I fractured something in my neck when I fell    Pertinent History Osteoarthritis, seronegative rheumatoid arthritis, HTN, HLD, Anxiety, sleep apnea    Patient Stated Goals "maintain what I currently have as it is probably going to get worse."    Pain Onset More than a month ago             [place]  Visits from Start of Care:  5  Current functional level related to goals / functional outcomes: Pt has improved and met all goals.   Remaining deficits: Pt continues with OA in BUE but is experiencing significant neck pain on the right.   Education / Equipment: HEPs - given theraputty but discontinued d/t right neck pain worsening with use.   Patient agrees to discharge. Patient goals were met. Patient is being discharged due to meeting the stated rehab goals..      Hot Pack applied to R shoulder and neck for pain relief during education.                     OT Short Term Goals - 01/02/21 1237       OT SHORT TERM GOAL #1   Title Pt will verbalize understanding of joint protection strategies    Time 4    Period Weeks    Status Achieved   discussed shoulder bags, protecting smaller joints, reviewed joint protection strategies - pt verbalizes understanding   Target Date 12/26/20      OT SHORT TERM GOAL #2   Title Pt will be independent with grip strength and coordination HEP  Time 4    Period Weeks    Status Achieved   pt has refrained from putty at this time d/t pain.     OT SHORT TERM GOAL #3   Title Pt will verbalize understanding of pain management strategies    Time 4    Period Weeks    Status Achieved      OT SHORT TERM GOAL #4   Title Pt will be independent with wear and care of any splints and/or braces for BUE PRN    Time 4    Period Weeks    Status Achieved   issued cmc thumb and cmc wrist brace BUE              OT Long Term Goals - 01/02/21 1237       OT LONG TERM GOAL #1   Title Pt will be independent with any updated HEPs    Time 10    Period Weeks    Status Achieved      OT LONG TERM GOAL #2   Title Pt will increase grip strength, bilaterally, to 30 lbs or greater to increase functional use of BUE.    Baseline R 24.9 lbs, L 29.3 lbs    Time 10    Period Weeks    Status Achieved   37.2 lbs R, 42.1 lbs L     OT LONG TERM GOAL #3   Title Pt will  increase write a paragraph of 5-7 sentences with pain no greater than 6/10 and adapted strategies PRN    Time 10    Period Weeks    Status Achieved      OT LONG TERM GOAL #4   Title Pt will verbalize understanding of adapted strategies for increasing ease and decreasing pain with ADLs and IADLs (cutting vegetables, opening containers (jar, mouthwash), shuffling cards, etc)    Time 10    Period Weeks    Status Achieved   pt reports this has gotten much better                  Plan - 01/02/21 1243     Clinical Impression Statement Pt has met all goals and is ready for discharge. Pt experiencing significant right neck pain that she is getting care for at this time.    OT Occupational Profile and History Problem Focused Assessment - Including review of records relating to presenting problem    Occupational performance deficits (Please refer to evaluation for details): ADL's;IADL's;Leisure    Body Structure / Function / Physical Skills UE functional use;Strength;Decreased knowledge of use of DME;ADL;FMC;Flexibility;Coordination;ROM;IADL;Pain;GMC;Dexterity    Rehab Potential Good    Clinical Decision Making Limited treatment options, no task modification necessary    Comorbidities Affecting Occupational Performance: None    Modification or Assistance to Complete Evaluation  No modification of tasks or assist necessary to complete eval    OT Frequency 1x / week    OT Duration Other (comment)   8 visits over 10 weeks d/t any scheduling conflicts that may come up   OT Treatment/Interventions Moist Heat;Fluidtherapy;Self-care/ADL training;Contrast Bath;Cryotherapy;Energy conservation;Therapeutic exercise;Ultrasound;Paraffin;Patient/family education;Passive range of motion;Therapeutic activities;Splinting;DME and/or AE instruction    Plan discharge OT    OT Home Exercise Plan [facility], joint protection    Consulted and Agree with Plan of Care Patient             Patient will benefit from  skilled therapeutic intervention in order to improve the following deficits and impairments:  Body Structure / Function / Physical Skills: UE functional use, Strength, Decreased knowledge of use of DME, ADL, FMC, Flexibility, Coordination, ROM, IADL, Pain, GMC, Dexterity       Visit Diagnosis: Stiffness of left hand, not elsewhere classified  Pain in joint of left hand  Pain in joint of right hand  Muscle weakness (generalized)  Stiffness of right hand, not elsewhere classified  Other lack of coordination    Problem List Patient Active Problem List   Diagnosis Date Noted   Contusion of right leg, initial encounter 12/06/2020   Age related osteoporosis 12/06/2020   Foot injury, left, subsequent encounter 12/06/2020   Age-related osteoporosis without current pathological fracture 12/04/2020   Restless leg syndrome 11/16/2020   History of breast biopsy 08/22/2020   Hypertension    Hyperlipidemia    Osteoarthritis    Anxiety    OSA on CPAP    Lesion of left external ear 01/22/2016   Recurrent major depressive disorder, in remission ([place]) 12/06/2015   Lower GI bleed 12/06/2015   Encounter for screening colonoscopy 11/21/2015   Dysphagia 11/07/2015   Cataract 06/02/2014   Epistaxis, recurrent 12/22/2013   Refusal of blood transfusions as patient is Jehovah's Witness 11/09/2013   Advance directive on file 11/09/2013   Fibromyalgia 08/26/2013    Zachery Conch, OT/L 01/02/2021, 1:15 PM  [facility] [address] [place], [place], [address] Phone: [phone]   Fax:  [phone]  Name: Diana [name] MRN: [id number] Date of Birth: 01/05/1956

## 2021-01-03 ENCOUNTER — Other Ambulatory Visit: Payer: Self-pay | Admitting: Surgical

## 2021-01-03 ENCOUNTER — Ambulatory Visit (HOSPITAL_COMMUNITY)
Admission: RE | Admit: 2021-01-03 | Discharge: 2021-01-03 | Disposition: A | Discharge status: Home / Self Care | Payer: Medicare Other | Source: Ambulatory Visit | Attending: Internal Medicine | Admitting: Internal Medicine

## 2021-01-03 DIAGNOSIS — M810 Age-related osteoporosis without current pathological fracture: Secondary | ICD-10-CM | POA: Insufficient documentation

## 2021-01-03 MED ORDER — DENOSUMAB 60 MG/ML ~~LOC~~ SOSY
PREFILLED_SYRINGE | SUBCUTANEOUS | Status: AC
  Administered 2021-01-03: 60 mg
  Filled 2021-01-03: qty 1

## 2021-01-03 MED ORDER — METHOCARBAMOL 500 MG PO TABS: 500.0000 mg | ORAL_TABLET | Freq: Three times a day (TID) | ORAL | 0 refills | Status: DC | PRN

## 2021-01-03 NOTE — Telephone Encounter (Signed)
Okay for muscle relaxer of your choice thx

## 2021-01-05 ENCOUNTER — Telehealth: Payer: Self-pay

## 2021-01-05 NOTE — Telephone Encounter (Signed)
Prior auth for Methocarbamol 500 mg sent through cover mymeds to [facility]. Sending message to check status of authorization.

## 2021-01-07 ENCOUNTER — Ambulatory Visit: Payer: Medicare Other

## 2021-01-09 ENCOUNTER — Encounter: Payer: Medicare Other | Admitting: Occupational Therapy

## 2021-01-10 ENCOUNTER — Encounter: Payer: Self-pay | Admitting: Family Medicine

## 2021-01-10 ENCOUNTER — Other Ambulatory Visit: Payer: Self-pay

## 2021-01-15 ENCOUNTER — Other Ambulatory Visit: Payer: Self-pay | Admitting: Internal Medicine

## 2021-01-15 ENCOUNTER — Telehealth: Payer: Self-pay

## 2021-01-15 DIAGNOSIS — R110 Nausea: Secondary | ICD-10-CM

## 2021-01-15 DIAGNOSIS — G2581 Restless legs syndrome: Secondary | ICD-10-CM

## 2021-01-15 NOTE — Telephone Encounter (Signed)
Patient advised refills sent by Dr. Dimple Casey today. Patient expressed understanding.

## 2021-01-15 NOTE — Telephone Encounter (Signed)
Patient called requesting prescription refills of Zofran and Pramipexole to be sent to [facility] pharmacy at [address]

## 2021-01-23 ENCOUNTER — Encounter: Payer: Medicare Other | Admitting: Occupational Therapy

## 2021-01-24 ENCOUNTER — Ambulatory Visit (INDEPENDENT_AMBULATORY_CARE_PROVIDER_SITE_OTHER): Payer: Medicare Other

## 2021-01-24 ENCOUNTER — Other Ambulatory Visit: Payer: Self-pay

## 2021-01-24 DIAGNOSIS — Z23 Encounter for immunization: Secondary | ICD-10-CM | POA: Diagnosis not present

## 2021-01-28 ENCOUNTER — Other Ambulatory Visit: Payer: Self-pay | Admitting: Medical

## 2021-01-30 ENCOUNTER — Encounter: Payer: Medicare Other | Admitting: Occupational Therapy

## 2021-01-31 ENCOUNTER — Telehealth: Payer: Self-pay | Admitting: Orthopedic Surgery

## 2021-01-31 ENCOUNTER — Telehealth: Payer: Self-pay

## 2021-01-31 DIAGNOSIS — G2581 Restless legs syndrome: Secondary | ICD-10-CM

## 2021-01-31 NOTE — Telephone Encounter (Signed)
Patient called. She would like some Tramadol called in for her pain at [facility] on [address]. Her call back number is [phone]

## 2021-01-31 NOTE — Telephone Encounter (Signed)
Patient called requesting prescription refill of Pramipexole to be sent to [facility] at [address]  Patient states some nights she had to take more than 3 tablets due to her leg cramps and that is why she is out of medication 2 weeks before her refill is due.  Patient requested a return call.

## 2021-02-01 ENCOUNTER — Telehealth: Payer: Self-pay

## 2021-02-01 NOTE — Telephone Encounter (Signed)
Pt called stating she was still waiting to hear something back about having a rx for tramadol called in. Pt would like a CB please  [phone]

## 2021-02-01 NOTE — Telephone Encounter (Signed)
Patient called to confirm Dr. Dimple Casey received her message from yesterday and to see if he needs to send an override to refill her prescription early.

## 2021-02-01 NOTE — Telephone Encounter (Signed)
Patient advised message was received and her prescription was sent to the pharmacy by Dr. Dimple Casey.

## 2021-02-02 ENCOUNTER — Other Ambulatory Visit: Payer: Self-pay | Admitting: Surgical

## 2021-02-02 ENCOUNTER — Other Ambulatory Visit: Payer: Self-pay | Admitting: Internal Medicine

## 2021-02-02 DIAGNOSIS — G2581 Restless legs syndrome: Secondary | ICD-10-CM

## 2021-02-02 MED ORDER — TRAMADOL HCL 50 MG PO TABS: 50.0000 mg | ORAL_TABLET | Freq: Every day | ORAL | 0 refills | Status: DC | PRN

## 2021-02-02 MED ORDER — PRAMIPEXOLE DIHYDROCHLORIDE 0.25 MG PO TABS: ORAL_TABLET | ORAL | 1 refills | Status: DC

## 2021-02-02 NOTE — Telephone Encounter (Signed)
Sent in

## 2021-02-02 NOTE — Telephone Encounter (Signed)
New Rx is sent, I recommend she try to avoid taking more that 3 tablets regularly this is th maximum recommended dose for restless leg syndrome.

## 2021-02-08 ENCOUNTER — Other Ambulatory Visit: Payer: Self-pay

## 2021-02-08 ENCOUNTER — Ambulatory Visit (INDEPENDENT_AMBULATORY_CARE_PROVIDER_SITE_OTHER): Payer: Medicare Other | Admitting: Medical

## 2021-02-08 VITALS — BP 140/90 | HR 86 | Temp 99.0°F | Wt 128.8 lb

## 2021-02-08 DIAGNOSIS — R202 Paresthesia of skin: Secondary | ICD-10-CM

## 2021-02-08 DIAGNOSIS — G2581 Restless legs syndrome: Secondary | ICD-10-CM

## 2021-02-08 DIAGNOSIS — G479 Sleep disorder, unspecified: Secondary | ICD-10-CM

## 2021-02-08 DIAGNOSIS — M1990 Unspecified osteoarthritis, unspecified site: Secondary | ICD-10-CM

## 2021-02-08 DIAGNOSIS — I10 Essential (primary) hypertension: Secondary | ICD-10-CM

## 2021-02-08 DIAGNOSIS — E611 Iron deficiency: Secondary | ICD-10-CM

## 2021-02-08 DIAGNOSIS — M797 Fibromyalgia: Secondary | ICD-10-CM

## 2021-02-08 NOTE — Progress Notes (Signed)
Subjective:  Diana [name] is a 65 y.o. female who presents for Chief Complaint  Patient presents with   possible neuropathy    Both feet and hand      Here for possible neuropathy.  She has hx/o bad arthritis in general.  She notes tingling and  numbness in both feet x 2-3 months.   She points to area of 2nd toe through 5th toe and including lateral foot from toes to mid foot , both left and right foot.   She notes numbness and tingling in both hands, mainly last 2 fingers and forearms of both arms.  X 2-3 months.    She denies history of diabetes, no history of heavy alcohol use and not using any alcohol currently.  She denies any recent trauma injury or fall.  No fever.  No incontinence.  She takes a variety of supplements.  No other aggravating or relieving factors.    No other c/o.  Past Medical History:  Diagnosis Date   Anxiety    Colon polyp    Fibromyalgia    Hyperlipidemia    Hypertension    Methotrexate, long term, current use    OSA on CPAP    Osteoarthritis    Current Outpatient Medications on File Prior to Visit  Medication Sig Dispense Refill   acetaminophen (TYLENOL) 500 MG tablet Take 1,000 mg by mouth 2 (two) times daily.     atorvastatin (LIPITOR) 10 MG tablet TAKE 1 TABLET BY MOUTH EVERY DAY AT BEDTIME 90 tablet 0   B Complex Vitamins (B COMPLEX 100 PO) Take 1 tablet by mouth daily.     busPIRone (BUSPAR) 10 MG tablet Take 10 mg by mouth daily.     Cetirizine HCl 10 MG CAPS Take 10 mg by mouth daily.     Cholecalciferol (VITAMIN D3) 50 MCG (2000 UT) CAPS Take by mouth.     Ferrous Sulfate (IRON PO) Take 65 mg by mouth daily.     Folic Acid (FOLATE PO) Take 1 mg by mouth daily.     hydrochlorothiazide (HYDRODIURIL) 12.5 MG tablet Take 1 tablet (12.5 mg total) by mouth daily. 90 tablet 3   ipratropium (ATROVENT) 0.03 % nasal spray Place 2 sprays into the nose 2 (two) times daily. 30 mL 5   LORazepam (ATIVAN) 1 MG tablet Take 1 mg by mouth 3 (three) times  daily as needed.     losartan (COZAAR) 50 MG tablet Take 1 tablet (50 mg total) by mouth daily. 30 tablet 2   Magnesium 250 MG TABS Take by mouth.     methocarbamol (ROBAXIN) 500 MG tablet Take 1 tablet (500 mg total) by mouth every 8 (eight) hours as needed. 30 tablet 0   Multiple Minerals-Vitamins (CALCIUM-MAGNESIUM-ZINC-D3 PO) Take 1 tablet by mouth in the morning, at noon, and at bedtime.     Multiple Vitamins-Minerals (ONE-A-DAY WOMENS 50+ PO) Take by mouth.     Omega-3 Fatty Acids (FISH OIL) 1000 MG CPDR Take 1 capsule by mouth daily.     ondansetron (ZOFRAN) 4 MG tablet TAKE 1 TABLET(4 MG) BY MOUTH DAILY AS NEEDED 20 tablet 0   pantoprazole (PROTONIX) 20 MG tablet Take 1 tablet (20 mg total) by mouth daily. 30 tablet 2   Potassium 99 MG TABS Take 1 tablet by mouth once.     pramipexole (MIRAPEX) 0.25 MG tablet TAKE 3 TABLETS(0.75 MG) BY MOUTH AT BEDTIME AS NEEDED 270 tablet 0   traMADol (ULTRAM) 50 MG tablet Take 1  tablet (50 mg total) by mouth daily as needed. 20 tablet 0   vitamin C (ASCORBIC ACID) 500 MG tablet Take 500 mg by mouth daily.     bisacodyl (DULCOLAX) 5 MG EC tablet Take 5 mg by mouth daily as needed for moderate constipation. (Patient not taking: Reported on 02/08/2021)     No current facility-administered medications on file prior to visit.     The following portions of the patient's history were reviewed and updated as appropriate: allergies, current medications, past family history, past medical history, past social history, past surgical history and problem list.  ROS Otherwise as in subjective above  Objective: BP 140/90   Pulse 86   Temp 99 F (37.2 C)   Wt 128 lb 12.8 oz (58.4 kg)   SpO2 97%   BMI 24.34 kg/m   General appearance: alert, no distress, well developed, well nourished Neck: supple, no lymphadenopathy, no thyromegaly, no masses Heart: RRR, normal S1, S2, no murmurs Lungs: CTA bilaterally, no wheezes, rhonchi, or rales Pulses: 2+ radial  pulses, 2+ pedal pulses, normal cap refill Ext: no edema MSK: Bilateral fingers with arthritic changes throughout particularly DIPs, bilateral feet with hammertoe deformity and arthritic changes of all second through fifth toes of both feet, no obvious swelling of hands wrist fingers or toes seemingly normal range of motion of fingers and toes  Right leg DTR 1+ whereas left leg the DTRs are 2+, strength and sensation normal throughout upper and lower extremities    Assessment: Encounter Diagnoses  Name Primary?   Paresthesia Yes   RLS (restless legs syndrome)    Sleep disturbance    Fibromyalgia    Primary hypertension    Osteoarthritis, unspecified osteoarthritis type, unspecified site    Iron deficiency      Plan: We discussed symptoms and concerns.  She sees orthopedics and rheumatology.  Thinking about the differential for paresthesias of extremities, she is not diabetic based on recent labs and no heavy alcohol use prior.  We will check some additional blood work today but will likely need to correspond with rheumatology orthopedics about doing nerve conduction testing or other evaluation  Continue current medications for blood pressure, cholesterol, and sleep aids  Diana [name] was seen today for possible neuropathy.  Diagnoses and all orders for this visit:  Paresthesia -     Sedimentation rate -     Vitamin B12 -     TSH -     Iron, TIBC and Ferritin Panel  RLS (restless legs syndrome) -     Sedimentation rate -     Vitamin B12 -     TSH  Sleep disturbance -     Sedimentation rate -     Vitamin B12 -     TSH  Fibromyalgia  Primary hypertension  Osteoarthritis, unspecified osteoarthritis type, unspecified site  Iron deficiency -     Iron, TIBC and Ferritin Panel   Follow up: Pending labs

## 2021-02-09 LAB — IRON,TIBC AND FERRITIN PANEL
Ferritin: 184 ng/mL — ABNORMAL HIGH (ref 15–150)
Iron Saturation: 23 % (ref 15–55)
Iron: 71 ug/dL (ref 27–139)
Total Iron Binding Capacity: 310 ug/dL (ref 250–450)
UIBC: 239 ug/dL (ref 118–369)

## 2021-02-09 LAB — TSH: TSH: 0.788 u[IU]/mL (ref 0.450–4.500)

## 2021-02-09 LAB — VITAMIN B12: Vitamin B-12: 573 pg/mL (ref 232–1245)

## 2021-02-09 LAB — SEDIMENTATION RATE: Sed Rate: 2 mm/hr (ref 0–40)

## 2021-02-09 NOTE — Progress Notes (Signed)
Hi Lauren.  Can you have her come in so we can set her up for nerve study for what looks to be some type of neuropathy.  Thanks

## 2021-02-20 ENCOUNTER — Telehealth: Payer: Self-pay | Admitting: Surgical

## 2021-02-20 NOTE — Telephone Encounter (Signed)
IC pharmacy and was advised they did not have any pending prescriptions that were needing PA.  Patient picked up ultram on 11/4.  IC patient to discuss. No answer. LMVM for her to call me back

## 2021-02-20 NOTE — Telephone Encounter (Signed)
Pt called requesting to send pre authorization for tramadol refill. Please contact pt once per Berkley Harvey has been sent. Pt phone number is [phone].

## 2021-02-23 ENCOUNTER — Encounter: Payer: Self-pay | Admitting: Surgical

## 2021-02-23 ENCOUNTER — Ambulatory Visit (INDEPENDENT_AMBULATORY_CARE_PROVIDER_SITE_OTHER): Payer: Medicare Other | Admitting: Surgical

## 2021-02-23 ENCOUNTER — Ambulatory Visit: Payer: Self-pay

## 2021-02-23 ENCOUNTER — Other Ambulatory Visit: Payer: Self-pay

## 2021-02-23 DIAGNOSIS — M79672 Pain in left foot: Secondary | ICD-10-CM

## 2021-02-27 ENCOUNTER — Telehealth: Payer: Self-pay | Admitting: Surgical

## 2021-02-27 ENCOUNTER — Other Ambulatory Visit: Payer: Self-pay | Admitting: Surgical

## 2021-02-27 DIAGNOSIS — G629 Polyneuropathy, unspecified: Secondary | ICD-10-CM

## 2021-02-27 MED ORDER — TRAMADOL HCL ER 100 MG PO TB24: 100.0000 mg | ORAL_TABLET | Freq: Every day | ORAL | 0 refills | Status: DC | PRN

## 2021-02-27 NOTE — Telephone Encounter (Signed)
Sent in.  Also can you refer her to Dr Alvester Morin for bilateral leg NCS?

## 2021-02-27 NOTE — Telephone Encounter (Signed)
Pt called requesting a refill of Tramadol. Pt states PA Franky Macho was to send in a few days ago. Please send to pharmacy on file. Pt phone number is [phone].

## 2021-02-27 NOTE — Telephone Encounter (Signed)
Referral entered to [facility] for Bilat LE EMG/NCV.

## 2021-03-02 ENCOUNTER — Telehealth: Payer: Self-pay | Admitting: Orthopedic Surgery

## 2021-03-02 ENCOUNTER — Other Ambulatory Visit: Payer: Self-pay | Admitting: Orthopedic Surgery

## 2021-03-02 MED ORDER — TRAMADOL HCL 50 MG PO TABS: 50.0000 mg | ORAL_TABLET | Freq: Two times a day (BID) | ORAL | 0 refills | Status: DC | PRN

## 2021-03-02 NOTE — Telephone Encounter (Signed)
Per insurance, tramadol ER is not covered. Dr. August Saucer sent in tramadol for patient. I called patient and advised.

## 2021-03-02 NOTE — Telephone Encounter (Signed)
I called patient to advise medication has been sent to pharmacy. She states that she was supposed to get 100mg  tramadol so it lasted longer. Per verbal from Dr. , ok for patient to take 2 tramadol at a time. Patient aware.

## 2021-03-02 NOTE — Telephone Encounter (Signed)
Pt called requesting Leotis Shames F to call her insurance company for an override to release her prescription of tramadol. Please call pt at [phone].

## 2021-03-04 NOTE — Progress Notes (Signed)
Office Visit Note  Patient: Diana [name]             Date of Birth: 02-08-1956           MRN: [id number]             PCP: Girtha Rm, PA-C Referring: Girtha Rm, PA-C Visit Date: 03/05/2021   Subjective:   History of Present Illness: Diana [name] is a 65 y.o. female here for follow up for osteoporosis treated last visit with bilateral 1st CMC injection and braces and osteoporosis recently started on prolia. 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 She did not feel much improvement with the thumb steroid injections at our last visit. She had no problems with the first prolia injection dose. She having increased trouble at night. Especially after prolonged walking and standing during the day leg pain at night is a lot worse. She finds the high dose pramipexole is needed to relieve her leg aching and burning but this increasing difficulty falling asleep. She is curious about alternative options for this.  Previous HPI 12/04/20 Diana [name] is a 65 y.o. female here for follow up for seronegative RA previously on methotrexate also osteoarthritis of multiple joints and recent metatarsal fractures. She feels about the same as at our previous visit most affected joints are in her hands especially thumbs. I was able to review documents detailing previous workup and treated with Dr. Vertell Limber in [place]. Findings there were consistent with extensive DIP predominant osteoarthritis possible erosive OA disease. Negative RF and CCP serology with normal inflammatory markers. She did show positive CarP testing on [facility] labs. Treatment with methotrexate started based on seronegative RA by 2010 criteria met not clear whether she ever had large improvement in symptoms or change in synovitis. Lab tests with normal vitamin D level in July and normal calcium and renal function in April.   Previous HPI: 11/16/20 Diana [name] is a 65 y.o.  female here for seronegative arthritis for which she has taken methotrexate. She has history of right knee arthroplasty and recent right metatarsal fracture. She has a history of age related osteoporosis. She has had previous gastric bypass surgery.  She was previously a patient of Dr. Vertell Limber in [place] and has moved to this area no longer on any specific RA treatments.  Verbal recounting of previous treatment history is unclear--for example she reports previous treatment for osteoporosis with Fosamax that was replaced with subcutaneous methotrexate Currently her biggest problem is joint pain in her bilateral hands with multiple deformities worst in the thumbs but also affecting the other digits.  She is very distressed by the right foot metatarsal fractures and deformity but denies having significant joint pain in that area currently.  She is also interested in continuing the treatment for her restless leg syndrome that has apparently been severe but improved with the pramipexole.   Review of Systems  Constitutional:  Positive for fatigue.  HENT:  Positive for mouth dryness.   Eyes:  Negative for dryness.  Respiratory:  Negative for shortness of breath.   Cardiovascular:  Negative for swelling in legs/feet.  Gastrointestinal:  Positive for constipation and diarrhea.  Endocrine: Positive for cold intolerance.  Genitourinary:  Negative for difficulty urinating.  Musculoskeletal:  Positive for joint pain, gait problem, joint pain, joint swelling, muscle weakness, morning stiffness and muscle tenderness.  Skin:  Negative for rash.  Allergic/Immunologic: Negative for susceptible to infections.  Neurological:  Negative for numbness.  Hematological:  Positive for bruising/bleeding tendency.  Psychiatric/Behavioral:  Positive for sleep disturbance.    PMFS History:  Patient Active Problem List   Diagnosis Date Noted   Contusion of right leg, initial encounter 12/06/2020   Age related  osteoporosis 12/06/2020   Foot injury, left, subsequent encounter 12/06/2020   Age-related osteoporosis without current pathological fracture 12/04/2020   Restless leg syndrome 11/16/2020   History of breast biopsy 08/22/2020   Hypertension    Hyperlipidemia    Osteoarthritis    Anxiety    OSA on CPAP    Lesion of left external ear 01/22/2016   Recurrent major depressive disorder, in remission ([place]) 12/06/2015   Lower GI bleed 12/06/2015   Encounter for screening colonoscopy 11/21/2015   Dysphagia 11/07/2015   Cataract 06/02/2014   Epistaxis, recurrent 12/22/2013   Refusal of blood transfusions as patient is Jehovah's Witness 11/09/2013   Advance directive on file 11/09/2013   Fibromyalgia 08/26/2013    Past Medical History:  Diagnosis Date   Anxiety    Colon polyp    Fibromyalgia    Hyperlipidemia    Hypertension    Insomnia    Methotrexate, long term, current use    OSA on CPAP    Osteoarthritis     Family History  Problem Relation Age of Onset   Hypertension Mother    AAA (abdominal aortic aneurysm) Mother    Colon polyps Father    Colon cancer Father    Hypertension Father    Prostate cancer Father    Clotting disorder Sister    Heart disease Brother    Heart disease Brother    Past Surgical History:  Procedure Laterality Date   CATARACT EXTRACTION     CHOLECYSTECTOMY     COLONOSCOPY     GASTRIC BYPASS     HERNIA REPAIR     REPLACEMENT TOTAL KNEE Right    SINUS IRRIGATION     TONSILLECTOMY     Social History   Social History Narrative   Not on file   Immunization History  Administered Date(s) Administered   Fluad Quad(high Dose 65+) 12/11/2020   Influenza,inj,Quad PF,6+ Mos 01/13/2014   Influenza-Unspecified 12/14/2016, 12/15/2018, 12/15/2019   Moderna Covid-19 Vaccine Bivalent Booster 62yr & up 01/24/2021   Moderna Sars-Covid-2 Vaccination 05/21/2019, 06/18/2019, 03/07/2020, 07/13/2020   Pneumococcal Polysaccharide-23 03/24/2014      Objective: Vital Signs: BP 112/72 (BP Location: Left Arm, Patient Position: Sitting, Cuff Size: Small)   Pulse 77   Resp 12   Ht '5\' 2"'  (1.575 m)   Wt 125 lb 12.8 oz (57.1 kg)   BMI 23.01 kg/m    Physical Exam Cardiovascular:     Rate and Rhythm: Normal rate and regular rhythm.  Pulmonary:     Effort: Pulmonary effort is normal.     Breath sounds: Normal breath sounds.  Skin:    General: Skin is warm and dry.  Neurological:     Mental Status: She is alert.     Musculoskeletal Exam:  Elbows full ROM no tenderness or swelling Wrists full ROM no tenderness or swelling Fingers severe squaring of first CMC joints bilaterally extensive Heberden's nodules deviation and rotations of distal finger joints decreased flexion range of motion, no tenderness or palpable synovitis Right knee hyperextensibility, left knee normal ROM, no effusions or tenderness  Investigation: No additional findings.  Imaging: No results found.  Recent Labs: Lab Results  Component Value Date   WBC 4.2 12/15/2020   HGB 12.6 12/15/2020  PLT 254 12/15/2020   NA 138 12/15/2020   K 4.2 12/15/2020   CL 103 12/15/2020   CO2 30 12/15/2020   GLUCOSE 111 (H) 12/15/2020   BUN 15 12/15/2020   CREATININE 0.77 12/15/2020   BILITOT 0.5 12/15/2020   ALKPHOS 135 (H) 08/10/2020   AST 19 12/15/2020   ALT 16 12/15/2020   PROT 6.0 (L) 12/15/2020   ALBUMIN 4.3 08/10/2020   CALCIUM 9.4 12/15/2020    Speciality Comments: No specialty comments available.  Procedures:  No procedures performed Allergies: Lyrica [pregabalin]   Assessment / Plan:     Visit Diagnoses: Osteoarthritis, unspecified osteoarthritis type, unspecified site  Discussed treatments for ongoing knee osteoarthritis symptoms including soft bracing the right knee to reduce frequency of hyperextensions, topical NSAIDs, turmeric supplementation, she does not tolerate oral NSAIDs due to GI surgery Hx and already had 2 arthroplasty on the involved  site but keeps having instability.  Age-related osteoporosis without current pathological fracture  Started prolia in 9/21 will need repeat in 6 mos time.  Restless leg syndrome  Discussed for a while currently on pramipexole at a high dose I am not highly familiar with alternative treatment options to recommend which would be best for symptom control with sleep difficulty as a side effect. I recommended she might do best with seeing neurology or a better expert on that topic, recent nerve conduction study was also apparently performed but not resulted yet.  Orders: No orders of the defined types were placed in this encounter.  No orders of the defined types were placed in this encounter.    Follow-Up Instructions: Return in about 6 months (around 09/02/2021) for OA/OP on prolia f/u 40mo.   CCollier Salina MD  Note - This record has been created using [employer]  Chart creation errors have been sought, but may not always  have been located. Such creation errors do not reflect on  the standard of medical care.

## 2021-03-05 ENCOUNTER — Other Ambulatory Visit: Payer: Self-pay

## 2021-03-05 ENCOUNTER — Telehealth: Payer: Self-pay | Admitting: Orthopedic Surgery

## 2021-03-05 ENCOUNTER — Ambulatory Visit (INDEPENDENT_AMBULATORY_CARE_PROVIDER_SITE_OTHER): Payer: Medicare Other | Admitting: Internal Medicine

## 2021-03-05 ENCOUNTER — Encounter: Payer: Self-pay | Admitting: Internal Medicine

## 2021-03-05 VITALS — BP 112/72 | HR 77 | Resp 12 | Ht 62.0 in | Wt 125.8 lb

## 2021-03-05 DIAGNOSIS — M810 Age-related osteoporosis without current pathological fracture: Secondary | ICD-10-CM

## 2021-03-05 DIAGNOSIS — M1990 Unspecified osteoarthritis, unspecified site: Secondary | ICD-10-CM | POA: Diagnosis not present

## 2021-03-05 DIAGNOSIS — G2581 Restless legs syndrome: Secondary | ICD-10-CM | POA: Diagnosis not present

## 2021-03-05 NOTE — Telephone Encounter (Signed)
I am not sure what she is talking about in terms of nerve study.  My last note from 6 weeks ago showed her metatarsal fracture is healing.  Note from [facility] on the 11th has not been dictated yet.  He is going to have to call her and figure out what is going on.  Thanks

## 2021-03-05 NOTE — Telephone Encounter (Signed)
Referral Notes Number of Notes: 3 . Type Date User Summary Attachment  General 03/02/2021  2:23 PM Jacqualyn Posey T Still waiting on notes to be completed so we will have to decline the referral at this time  -  Note   Still waiting on notes to be completed so we will have to decline the referral at this time          This is from the neurology, waiting on dicatation

## 2021-03-05 NOTE — Patient Instructions (Signed)
Your prolia injection was on 9/21 next would be in 6 months I will make sure we schedule this and follow up accordingly

## 2021-03-05 NOTE — Telephone Encounter (Signed)
Patient request call back with the status of the nerve study. She states its been a week

## 2021-03-06 ENCOUNTER — Telehealth: Payer: Self-pay | Admitting: Pharmacist

## 2021-03-06 NOTE — Telephone Encounter (Signed)
Called patient and advised that we need labs completed prior to ecah Prolia dose. Advised that we will reach out to her to remind her that labs are due prior to placing orders w [facility]  Her next Prolia dose is due 07/02/21  She verbalized understanding  Chesley Mires, PharmD, MPH, BCPS Clinical Pharmacist (Rheumatology and Pulmonology)

## 2021-03-06 NOTE — Progress Notes (Signed)
Office Visit Note   Patient: Diana [name]           Date of Birth: [birth year]           MRN: [id number] Visit Date: 02/23/2021 Requested by: Avanell Shackleton, PA-C No address on file PCP: Ishmael Holter  Subjective: Chief Complaint  Patient presents with   Right Foot - Follow-up   Left Foot - Follow-up    HPI: Diana [name] is a 65 y.o. female who presents to the office complaining of bilateral foot and bilateral arm numbness/tingling.  She states that she has had symptoms for 3 to 4 months without pain.  Localizes the worst symptoms to her feet along the lateral aspect of the dorsal feet.  Denies any plantar numbness or tingling.  No radicular pain down the legs or any low back pain.  She states she is has a history of a "cyst in spine" developed 25 years ago following a horse injury.  She also reports fourth and fifth finger numbness and tingling in both hands that travels from her elbows.  She has no radicular pain down the arms.  No neck pain.  She denies any motor difficulties with either arm or foot.  No vision changes.  She denies any difficulties with balance that is new.  All of her symptoms have been ongoing over the last 3 to 4 months.  She has seen her primary care provider who ordered multiple lab tests in order to rule out some causes of neuropathy such as B12 deficiency or thyroid disorder.              ROS: All systems reviewed are negative as they relate to the chief complaint within the history of present illness.  Patient denies fevers or chills.  Assessment & Plan: Visit Diagnoses:  1. Pain in left foot     Plan: Patient is a 65 year old female who presents for evaluation of bilateral foot and bilateral arm numbness and tingling.  She has been having symptoms for 3 to 4 months.  Lab tests are negative for any cause of the symptoms.  No motor dysfunction, gait change, vision changes concerning for demyelinating disease.  Seems to be just lack of  sensation and tingling sensation.  Impression is likely ulnar nerve compression based on the distribution of her upper extremity symptoms.  Plan to order nerve conduction study of bilateral upper extremity and bilateral lower extremity for further evaluation.  Follow-up with the office after to review results.  Follow-Up Instructions: No follow-ups on file.   Orders:  Orders Placed This Encounter  Procedures   XR Foot Complete Left   No orders of the defined types were placed in this encounter.     Procedures: No procedures performed   Clinical Data: No additional findings.  Objective: Vital Signs: There were no vitals taken for this visit.  Physical Exam:  Constitutional: Patient appears well-developed HEENT:  Head: Normocephalic Eyes:EOM are normal Neck: Normal range of motion Cardiovascular: Normal rate Pulmonary/chest: Effort normal Neurologic: Patient is alert Skin: Skin is warm Psychiatric: Patient has normal mood and affect  Ortho Exam: Ortho exam demonstrates 5/5 motor strength of bilateral grip strength, finger abduction, pronation/supination, bicep, tricep, deltoid.  5/5 motor strength of bilateral hip flexion, quadricep, hamstring, dorsiflexion, plantarflexion.  Sensation intact through all dermatomes of bilateral lower extremities aside from some numbness that is present on the dorsal lateral aspect of both feet.  Intact sensation in the first webspace bilaterally.  Intact sensation over the plantar aspect of the foot bilaterally.  No weakness with dorsiflexion complex flexion, inversion, eversion.  No deformity noted but there is small area of swelling and tenderness over midfoot consistent with midfoot arthritis.  Sensation intact through all dermatomes of the bilateral upper extremities aside from numbness noted subjectively through the palmar aspect of the fourth and fifth fingers and increased sensitivity along the ulnar aspect of the forearm.  Positive elbow  flexion test bilaterally.  Specialty Comments:  No specialty comments available.  Imaging: No results found.   PMFS History: Patient Active Problem List   Diagnosis Date Noted   Contusion of right leg, initial encounter 12/06/2020   Age related osteoporosis 12/06/2020   Foot injury, left, subsequent encounter 12/06/2020   Age-related osteoporosis without current pathological fracture 12/04/2020   Restless leg syndrome 11/16/2020   History of breast biopsy 08/22/2020   Hypertension    Hyperlipidemia    Osteoarthritis    Anxiety    OSA on CPAP    Lesion of left external ear 01/22/2016   Recurrent major depressive disorder, in remission ([facility]) 12/06/2015   Lower GI bleed 12/06/2015   Encounter for screening colonoscopy 11/21/2015   Dysphagia 11/07/2015   Cataract 06/02/2014   Epistaxis, recurrent 12/22/2013   Refusal of blood transfusions as patient is Jehovah's Witness 11/09/2013   Advance directive on file 11/09/2013   Fibromyalgia 08/26/2013   Past Medical History:  Diagnosis Date   Anxiety    Colon polyp    Fibromyalgia    Hyperlipidemia    Hypertension    Insomnia    Methotrexate, long term, current use    OSA on CPAP    Osteoarthritis     Family History  Problem Relation Age of Onset   Hypertension Mother    AAA (abdominal aortic aneurysm) Mother    Colon polyps Father    Colon cancer Father    Hypertension Father    Prostate cancer Father    Clotting disorder Sister    Heart disease Brother    Heart disease Brother     Past Surgical History:  Procedure Laterality Date   CATARACT EXTRACTION     CHOLECYSTECTOMY     COLONOSCOPY     GASTRIC BYPASS     HERNIA REPAIR     REPLACEMENT TOTAL KNEE Right    SINUS IRRIGATION     TONSILLECTOMY     Social History   Occupational History   Not on file  Tobacco Use   Smoking status: Never   Smokeless tobacco: Never  Vaping Use   Vaping Use: Never used  Substance and Sexual Activity   Alcohol use: Never    Drug use: Never   Sexual activity: Not Currently

## 2021-03-06 NOTE — Telephone Encounter (Signed)
-----   Message from Henriette Combs, LPN sent at 89/84/2103  8:26 AM EST ----- Regarding: FW: Prolia f/u  ----- Message ----- From: Fuller Plan, MD Sent: 03/05/2021   9:02 PM EST To: [facility] Clinical Subject: Prolia f/u                                     Ms. Mashaw was asking about next prolia dose, she just had first on 01/03/21 which was uneventful and would be due at 72mos, she asked about scheduling for this at office visit today.

## 2021-03-07 ENCOUNTER — Encounter: Payer: Self-pay | Admitting: Surgical

## 2021-03-07 NOTE — Telephone Encounter (Signed)
Still need notes dictated- this is all neurology is waiting on. thanks

## 2021-03-13 ENCOUNTER — Encounter: Payer: Self-pay | Admitting: Neurology

## 2021-03-13 ENCOUNTER — Other Ambulatory Visit: Payer: Self-pay

## 2021-03-13 DIAGNOSIS — R202 Paresthesia of skin: Secondary | ICD-10-CM

## 2021-03-13 NOTE — Telephone Encounter (Signed)
I sent message to Annabelle Harman advising notes are in

## 2021-03-15 ENCOUNTER — Other Ambulatory Visit: Payer: Self-pay

## 2021-03-15 ENCOUNTER — Telehealth (INDEPENDENT_AMBULATORY_CARE_PROVIDER_SITE_OTHER): Payer: Medicare Other | Admitting: Medical

## 2021-03-15 VITALS — BP 117/82 | HR 82 | Temp 96.6°F | Wt 124.0 lb

## 2021-03-15 DIAGNOSIS — J0110 Acute frontal sinusitis, unspecified: Secondary | ICD-10-CM

## 2021-03-15 MED ORDER — AMOXICILLIN 875 MG PO TABS
875.0000 mg | ORAL_TABLET | Freq: Two times a day (BID) | ORAL | 0 refills | Status: AC
Start: 2021-03-15 — End: 2021-03-25

## 2021-03-15 NOTE — Progress Notes (Signed)
Subjective:     Patient ID: Diana [name], female   DOB: 09-05-1955, 65 y.o.   MRN: [id number]  This visit type was conducted due to national recommendations for restrictions regarding the COVID-19 Pandemic (e.g. social distancing) in an effort to limit this patient's exposure and mitigate transmission in our community.  Due to their co-morbid illnesses, this patient is at least at moderate risk for complications without adequate follow up.  This format is felt to be most appropriate for this patient at this time.    Documentation for virtual audio and video telecommunications through [place] encounter:  The patient was located at home. The provider was located in the office. The patient did consent to this visit and is aware of possible charges through their insurance for this visit.  The other persons participating in this telemedicine service were none. Time spent on call was 20 minutes and in review of previous records 20 minutes total.  This virtual service is not related to other E/M service within previous 7 days.   HPI Chief Complaint  Patient presents with   Sinusitis    Sinus infection- x 1 week, symptoms, congestion, sinus pressure, HA, yellow mucous   Virtual consult for illness.  She notes sinus infection x 1 week.   She notes sinus pressure, headaches, top teeth ache, mucous is yellow green, drainage.  Ears popping.  Has sore throat. Living on mucinex for a week.  No body aches, no chills, no fever, no NVD.  No cough.  +grandson had illness last week.  No recent covid test.  Has neti pot but hasn't used it yet.  No other aggravating or relieving factors. No other complaint.  Past Medical History:  Diagnosis Date   Anxiety    Colon polyp    Fibromyalgia    Hyperlipidemia    Hypertension    Insomnia    Methotrexate, long term, current use    OSA on CPAP    Osteoarthritis    Current Outpatient Medications on File Prior to Visit  Medication Sig Dispense Refill    acetaminophen (TYLENOL) 500 MG tablet Take 1,000 mg by mouth 2 (two) times daily.     atorvastatin (LIPITOR) 10 MG tablet TAKE 1 TABLET BY MOUTH EVERY DAY AT BEDTIME 90 tablet 0   B Complex Vitamins (B COMPLEX 100 PO) Take 1 tablet by mouth daily.     busPIRone (BUSPAR) 10 MG tablet Take 10 mg by mouth daily.     Cetirizine HCl 10 MG CAPS Take 10 mg by mouth daily.     Cholecalciferol (VITAMIN D3) 50 MCG (2000 UT) CAPS Take by mouth.     Ferrous Sulfate (IRON PO) Take 65 mg by mouth daily.     folic acid (FOLVITE) 1 MG tablet Take 1 mg by mouth daily.     hydrochlorothiazide (HYDRODIURIL) 12.5 MG tablet Take 1 tablet (12.5 mg total) by mouth daily. 90 tablet 3   ipratropium (ATROVENT) 0.03 % nasal spray Place 2 sprays into the nose 2 (two) times daily. 30 mL 5   LORazepam (ATIVAN) 1 MG tablet Take 1 mg by mouth 3 (three) times daily as needed.     losartan (COZAAR) 50 MG tablet Take 1 tablet (50 mg total) by mouth daily. 30 tablet 2   Magnesium 250 MG TABS Take by mouth.     Multiple Minerals-Vitamins (CALCIUM-MAGNESIUM-ZINC-D3 PO) Take 1 tablet by mouth in the morning, at noon, and at bedtime.     Multiple Vitamins-Minerals (ONE-A-DAY WOMENS  50+ PO) Take by mouth.     Omega-3 Fatty Acids (FISH OIL) 1000 MG CPDR Take 1 capsule by mouth daily.     ondansetron (ZOFRAN) 4 MG tablet TAKE 1 TABLET(4 MG) BY MOUTH DAILY AS NEEDED 20 tablet 0   pantoprazole (PROTONIX) 20 MG tablet Take 1 tablet (20 mg total) by mouth daily. 30 tablet 2   Potassium 99 MG TABS Take 1 tablet by mouth once.     pramipexole (MIRAPEX) 0.25 MG tablet TAKE 3 TABLETS(0.75 MG) BY MOUTH AT BEDTIME AS NEEDED 270 tablet 0   sertraline (ZOLOFT) 50 MG tablet Take by mouth.     traMADol (ULTRAM) 50 MG tablet Take 1 tablet (50 mg total) by mouth every 12 (twelve) hours as needed. 30 tablet 0   traZODone (DESYREL) 100 MG tablet Take 100 mg by mouth at bedtime.     vitamin C (ASCORBIC ACID) 500 MG tablet Take 500 mg by mouth daily.      zolpidem (AMBIEN CR) 12.5 MG CR tablet Take 12.5 mg by mouth at bedtime.     No current facility-administered medications on file prior to visit.    Review of Systems As in subjective    Objective:   Physical Exam Due to coronavirus pandemic stay at home measures, patient visit was virtual and they were not examined in person.   Temp (!) 96.6 F (35.9 C)   Wt 124 lb (56.2 kg)   BMI 22.68 kg/m   Gen: nad No labored breathing, no witnessed wheezing or cough       Assessment:     Encounter Diagnosis  Name Primary?   Acute non-recurrent frontal sinusitis Yes       Plan:     Discussed limitations of virtual consult.  Symptoms suggest sinusitis.  Begin medication below, continue Mucinex a few more days, and add on the Nettie pot that she has at home.  If not much improved within the next 3 to 4 days then call back.  If new or worse symptoms over the weekend call after-hours line or get reevaluated.  Diana [name] was seen today for sinusitis.  Diagnoses and all orders for this visit:  Acute non-recurrent frontal sinusitis  Other orders -     amoxicillin (AMOXIL) 875 MG tablet; Take 1 tablet (875 mg total) by mouth 2 (two) times daily for 10 days.  F/u prn

## 2021-03-23 ENCOUNTER — Telehealth: Payer: Self-pay | Admitting: Orthopedic Surgery

## 2021-03-23 NOTE — Telephone Encounter (Signed)
Patient called. She would like to know if there is a cream she can use for her neck. She has tried biofreeze and icy hot but would like a pain medicated cream. Her call back number is [phone]

## 2021-03-25 NOTE — Telephone Encounter (Signed)
Recommend Voltaren gel which is OTC

## 2021-03-26 NOTE — Telephone Encounter (Signed)
Contacted patient and she states that she has tried voltaren cream and hemp cream with no relief of pain. She is wanting to know if this is a prescription cream that is able to be prescribed to her.

## 2021-03-27 ENCOUNTER — Other Ambulatory Visit: Payer: Self-pay | Admitting: Family Medicine

## 2021-03-27 MED ORDER — PANTOPRAZOLE SODIUM 20 MG PO TBEC: 20.0000 mg | DELAYED_RELEASE_TABLET | Freq: Every day | ORAL | 1 refills | Status: AC

## 2021-03-30 ENCOUNTER — Other Ambulatory Visit: Payer: Self-pay

## 2021-03-30 ENCOUNTER — Other Ambulatory Visit: Payer: Self-pay | Admitting: Surgical

## 2021-03-30 MED ORDER — LOSARTAN POTASSIUM 50 MG PO TABS: 50.0000 mg | ORAL_TABLET | Freq: Every day | ORAL | 2 refills | Status: AC

## 2021-03-30 MED ORDER — DICLOFENAC SODIUM 2 % EX SOLN: CUTANEOUS | 0 refills | Status: DC

## 2021-03-30 NOTE — Telephone Encounter (Signed)
Sent in RX for stronger diclofenac gel. Sent msg to pt

## 2021-04-02 NOTE — Telephone Encounter (Signed)
noted 

## 2021-04-03 ENCOUNTER — Ambulatory Visit (INDEPENDENT_AMBULATORY_CARE_PROVIDER_SITE_OTHER): Payer: Medicare Other | Admitting: Neurology

## 2021-04-03 ENCOUNTER — Telehealth: Payer: Self-pay | Admitting: Orthopedic Surgery

## 2021-04-03 ENCOUNTER — Other Ambulatory Visit: Payer: Self-pay | Admitting: Surgical

## 2021-04-03 ENCOUNTER — Other Ambulatory Visit: Payer: Self-pay

## 2021-04-03 DIAGNOSIS — R202 Paresthesia of skin: Secondary | ICD-10-CM | POA: Diagnosis not present

## 2021-04-03 NOTE — Telephone Encounter (Signed)
Patient returned call asked for a call back. Patient asked if the paperwork was filled out for the topical gel for an over ride to get the Rx filled.  If not can the Rx be sent in by pill form. Per patient the pill form does not need an approval. The number to contact  patient is [phone]

## 2021-04-03 NOTE — Procedures (Signed)
[facility]  [address] [place], [address]  [place], [place] [address] Tel: [phone] Fax:  [phone] Test Date:  04/03/2021  Patient: Diana [name] DOB: 04-18-1955 Physician: Nita Sickle, DO  Sex: Female Height: 5\' 2"  Ref Phys: , PA  ID#: Harriette Bouillon   Technician:    Patient Complaints: This is a 65 year old female referred for evaluation of bilateral feet paresthesias and pain.  NCV & EMG Findings: Electrodiagnostic testing of the right lower extremity and additional studies of the left shows: Bilateral sural and superficial peroneal sensory responses are within normal limits. Bilateral peroneal and tibial motor responses are within normal limits. Bilateral tibial H reflex studies are within normal limits. There is no evidence of active or chronic motor axonal changes affecting any of the tested muscles.  Motor unit configuration and recruitment pattern is within normal limits.  Impression: This is a normal study of the lower extremities.  In particular, there is no evidence of a sensorimotor polyneuropathy or lumbosacral radiculopathy.   ___________________________ 79, DO    Nerve Conduction Studies Anti Sensory Summary Table   Stim Site NR Peak (ms) Norm Peak (ms) P-T Amp (V) Norm P-T Amp  Left Sup Peroneal Anti Sensory (Ant Lat Mall)  32C  12 cm    2.6 <4.6 10.2 >3  Right Sup Peroneal Anti Sensory (Ant Lat Mall)  32C  12 cm    2.4 <4.6 12.2 >3  Left Sural Anti Sensory (Lat Mall)  32C  Calf    3.0 <4.6 10.4 >3  Right Sural Anti Sensory (Lat Mall)  32C  Calf    2.8 <4.6 9.3 >3   Motor Summary Table   Stim Site NR Onset (ms) Norm Onset (ms) O-P Amp (mV) Norm O-P Amp Site1 Site2 Delta-0 (ms) Dist (cm) Vel (m/s) Norm Vel (m/s)  Left Peroneal Motor (Ext Dig Brev)  32C  Ankle    4.1 <6.0 3.6 >2.5 B Fib Ankle 8.5 37.0 44 >40  B Fib    12.6  3.2  Poplt B Fib 1.5 8.0 53 >40  Poplt    14.1  3.0         Right Peroneal Motor  (Ext Dig Brev)  32C  Ankle    4.1 <6.0 4.7 >2.5 B Fib Ankle 7.9 34.0 43 >40  B Fib    12.0  3.9  Poplt B Fib 1.8 8.0 44 >40  Poplt    13.8  4.0         Left Tibial Motor (Abd Hall Brev)  32C  Ankle    4.1 <6.0 6.5 >4 Knee Ankle 8.2 41.0 50 >40  Knee    12.3  4.3         Right Tibial Motor (Abd Hall Brev)  32C  Ankle    3.7 <6.0 4.7 >4 Knee Ankle 9.1 42.0 46 >40  Knee    12.8  4.1          H Reflex Studies   NR H-Lat (ms) Lat Norm (ms) L-R H-Lat (ms)  Left Tibial (Gastroc)  32C     29.80 <35 2.45  Right Tibial (Gastroc)  32C     32.24 <35 2.45   EMG   Side Muscle Ins Act Fibs Psw Fasc Number Recrt Dur Dur. Amp Amp. Poly Poly. Comment  Right AntTibialis Nml Nml Nml Nml Nml Nml Nml Nml Nml Nml Nml Nml N/A  Right Gastroc Nml Nml Nml Nml Nml Nml Nml Nml Nml Nml Nml  Nml N/A  Right Flex Dig Long Nml Nml Nml Nml Nml Nml Nml Nml Nml Nml Nml Nml N/A  Right RectFemoris Nml Nml Nml Nml Nml Nml Nml Nml Nml Nml Nml Nml N/A  Right GluteusMed Nml Nml Nml Nml Nml Nml Nml Nml Nml Nml Nml Nml N/A  Left AntTibialis Nml Nml Nml Nml Nml Nml Nml Nml Nml Nml Nml Nml N/A  Left Gastroc Nml Nml Nml Nml Nml Nml Nml Nml Nml Nml Nml Nml N/A  Left Flex Dig Long Nml Nml Nml Nml Nml Nml Nml Nml Nml Nml Nml Nml N/A  Left RectFemoris Nml Nml Nml Nml Nml Nml Nml Nml Nml Nml Nml Nml N/A  Left GluteusMed Nml Nml Nml Nml Nml Nml Nml Nml Nml Nml Nml Nml N/A      Waveforms:

## 2021-04-03 NOTE — Telephone Encounter (Signed)
Yeah we can try oral voltaren 75mg  BID #60 as long as she has no contra-indications from her PCP. Looks like her kidney function is good

## 2021-04-04 MED ORDER — DICLOFENAC SODIUM 75 MG PO TBEC: 75.0000 mg | DELAYED_RELEASE_TABLET | Freq: Two times a day (BID) | ORAL | 0 refills | Status: DC

## 2021-04-04 NOTE — Telephone Encounter (Signed)
Voltaren sent to pharmacy

## 2021-04-04 NOTE — Telephone Encounter (Signed)
I called patient and advised. 

## 2021-04-24 ENCOUNTER — Other Ambulatory Visit: Payer: Self-pay | Admitting: Family Medicine

## 2021-04-25 ENCOUNTER — Telehealth (INDEPENDENT_AMBULATORY_CARE_PROVIDER_SITE_OTHER): Payer: Medicare Other | Admitting: Medical

## 2021-04-25 ENCOUNTER — Other Ambulatory Visit: Payer: Self-pay

## 2021-04-25 VITALS — BP 137/91 | HR 76 | Temp 97.5°F | Wt 124.0 lb

## 2021-04-25 DIAGNOSIS — J019 Acute sinusitis, unspecified: Secondary | ICD-10-CM | POA: Diagnosis not present

## 2021-04-25 MED ORDER — AZITHROMYCIN 250 MG PO TABS: ORAL_TABLET | ORAL | 0 refills | Status: AC

## 2021-04-25 NOTE — Progress Notes (Signed)
Subjective:     Patient ID: Diana [name], female   DOB: 1955-10-08, 66 y.o.   MRN: [id number]  This visit type was conducted due to national recommendations for restrictions regarding the COVID-19 Pandemic (e.g. social distancing) in an effort to limit this patient's exposure and mitigate transmission in our community.  Due to their co-morbid illnesses, this patient is at least at moderate risk for complications without adequate follow up.  This format is felt to be most appropriate for this patient at this time.    Documentation for virtual audio and video telecommunications through [place] encounter:  The patient was located at home. The provider was located in the office. The patient did consent to this visit and is aware of possible charges through their insurance for this visit.  The other persons participating in this telemedicine service were none. Time spent on call was 20 minutes and in review of previous records 20 minutes total.  This virtual service is not related to other E/M service within previous 7 days.   HPI Chief Complaint  Patient presents with   Recurrent Sinusitis    Had sinus infection last month and had 10 days antibiotics. Ears pain-popping, nasal congestion, sinus pressure x 7 days   Virtual for another sinus infection.  I had a consult with her at 1 December for the same.  That 1 got better but she has a new episode now.  She notes sinus pressure that is intense, postnasal drip, both ears achy and popping, some loose stool, no nausea or vomiting.  No fever.  No body aches.  She feels cold all the time but no new chills.  Upper teeth are achy.  No cough no shortness of breath.  Using Nettie pot, mucus relief over-the-counter.   No sick contacts with flu or COVID.  She has not done a recent COVID test.  No other aggravating or relieving factors. No other complaint.  Past Medical History:  Diagnosis Date   Anxiety    Colon polyp    Fibromyalgia     Hyperlipidemia    Hypertension    Insomnia    Methotrexate, long term, current use    OSA on CPAP    Osteoarthritis    Current Outpatient Medications on File Prior to Visit  Medication Sig Dispense Refill   acetaminophen (TYLENOL) 500 MG tablet Take 1,000 mg by mouth 2 (two) times daily.     atorvastatin (LIPITOR) 10 MG tablet TAKE 1 TABLET BY MOUTH EVERY DAY AT BEDTIME 90 tablet 0   B Complex Vitamins (B COMPLEX 100 PO) Take 1 tablet by mouth daily.     busPIRone (BUSPAR) 10 MG tablet Take 10 mg by mouth daily.     Cetirizine HCl 10 MG CAPS Take 10 mg by mouth daily.     Cholecalciferol (VITAMIN D3) 50 MCG (2000 UT) CAPS Take by mouth.     diclofenac (VOLTAREN) 75 MG EC tablet Take 1 tablet (75 mg total) by mouth 2 (two) times daily. 60 tablet 0   Ferrous Sulfate (IRON PO) Take 65 mg by mouth daily.     folic acid (FOLVITE) 1 MG tablet Take 1 mg by mouth daily.     hydrochlorothiazide (HYDRODIURIL) 12.5 MG tablet Take 1 tablet (12.5 mg total) by mouth daily. 90 tablet 3   ipratropium (ATROVENT) 0.03 % nasal spray Place 2 sprays into the nose 2 (two) times daily. 30 mL 5   LORazepam (ATIVAN) 1 MG tablet Take 1 mg by mouth  3 (three) times daily as needed.     losartan (COZAAR) 50 MG tablet Take 1 tablet (50 mg total) by mouth daily. 30 tablet 2   Magnesium 250 MG TABS Take by mouth.     Multiple Minerals-Vitamins (CALCIUM-MAGNESIUM-ZINC-D3 PO) Take 1 tablet by mouth in the morning, at noon, and at bedtime.     Multiple Vitamins-Minerals (ONE-A-DAY WOMENS 50+ PO) Take by mouth.     Omega-3 Fatty Acids (FISH OIL) 1000 MG CPDR Take 1 capsule by mouth daily.     ondansetron (ZOFRAN) 4 MG tablet TAKE 1 TABLET(4 MG) BY MOUTH DAILY AS NEEDED 20 tablet 0   pantoprazole (PROTONIX) 20 MG tablet Take 1 tablet (20 mg total) by mouth daily. 90 tablet 1   Potassium 99 MG TABS Take 1 tablet by mouth once.     pramipexole (MIRAPEX) 0.25 MG tablet TAKE 3 TABLETS(0.75 MG) BY MOUTH AT BEDTIME AS NEEDED 270  tablet 0   sertraline (ZOLOFT) 50 MG tablet Take by mouth.     traZODone (DESYREL) 100 MG tablet Take 100 mg by mouth at bedtime.     vitamin C (ASCORBIC ACID) 500 MG tablet Take 500 mg by mouth daily.     zolpidem (AMBIEN CR) 12.5 MG CR tablet Take 12.5 mg by mouth at bedtime.     No current facility-administered medications on file prior to visit.    Review of Systems As in subjective    Objective:   Physical Exam Due to coronavirus pandemic stay at home measures, patient visit was virtual and they were not examined in person.   Wt 124 lb (56.2 kg)    BMI 22.68 kg/m   General: Well-developed well-nourished no acute distress, somewhat ill-appearing No wheezing or labored breathing     Assessment:     Encounter Diagnosis  Name Primary?   Acute sinusitis, recurrence not specified, unspecified location Yes       Plan:     We discussed symptoms and concerns.  We discussed limitations of virtual consult.  Continue Nettie pot and mucus relief.  Continue Tylenol as needed.  Begin medication below.  She did a round of amoxicillin a month ago.  If not completely resolved within a week call back or recheck.  If worse in the next few days call back.  There are no diagnoses linked to this encounter.  Arletta was seen today for recurrent sinusitis.  Diagnoses and all orders for this visit:  Acute sinusitis, recurrence not specified, unspecified location  Other orders -     azithromycin (ZITHROMAX) 250 MG tablet; 2 tablets day 1, then 1 tablet days 2-4   F/u prn

## 2021-04-29 ENCOUNTER — Other Ambulatory Visit: Payer: Self-pay | Admitting: Surgical

## 2021-04-29 DIAGNOSIS — M542 Cervicalgia: Secondary | ICD-10-CM

## 2021-05-01 ENCOUNTER — Other Ambulatory Visit: Payer: Self-pay | Admitting: Medical

## 2021-05-30 ENCOUNTER — Other Ambulatory Visit: Payer: Self-pay | Admitting: Surgical

## 2021-05-30 DIAGNOSIS — M542 Cervicalgia: Secondary | ICD-10-CM

## 2021-06-23 ENCOUNTER — Other Ambulatory Visit: Payer: Self-pay | Admitting: Family Medicine

## 2021-06-25 ENCOUNTER — Telehealth: Payer: Self-pay | Admitting: Pharmacist

## 2021-06-25 NOTE — Telephone Encounter (Signed)
Patient due for Prolia on 07/02/21 ? ?Patient states she moved back to [facility] and her OBGYN stated that they can give Prolia injection at their office. Patient states she sees Financial trader. Notes from Dr. Dimple Casey and first Prolia on 01/03/21 faxed to [facility] ? ?Office Phone: [phone] ?Office Fax: [phone] ?  ?Chesley Mires, PharmD, MPH, BCPS ?Clinical Pharmacist (Rheumatology and Pulmonology) ?

## 2021-07-03 ENCOUNTER — Ambulatory Visit: Payer: Medicare Other | Admitting: Physician Assistant

## 2021-09-03 ENCOUNTER — Ambulatory Visit: Payer: Medicare Other | Admitting: Internal Medicine

## 2021-09-16 ENCOUNTER — Other Ambulatory Visit: Payer: Self-pay | Admitting: Family Medicine

## 2021-09-17 NOTE — Telephone Encounter (Signed)
Pt is due for an appt. We have no power so I am denying med as pt needs an appt

## 2023-09-04 IMAGING — CT CT CERVICAL SPINE W/O CM
2 series · 10 of 14 positions shown, 12 images · non-contrast
Comparison: None.

CLINICAL DATA: Possible fracture on cervical spine x-ray at
chiropractor office.

EXAM:
CT CERVICAL SPINE WITHOUT CONTRAST
TECHNIQUE: Multidetector CT imaging of the cervical spine was performed without
intravenous contrast. Multiplanar CT image reconstructions were also
generated.

[Series 2: cspine soft · axial · 0.27mm/px · z∈[+48,+170]mm · 5 of 93 slices shown, 7 images]
[im 16/93  soft-tissue]
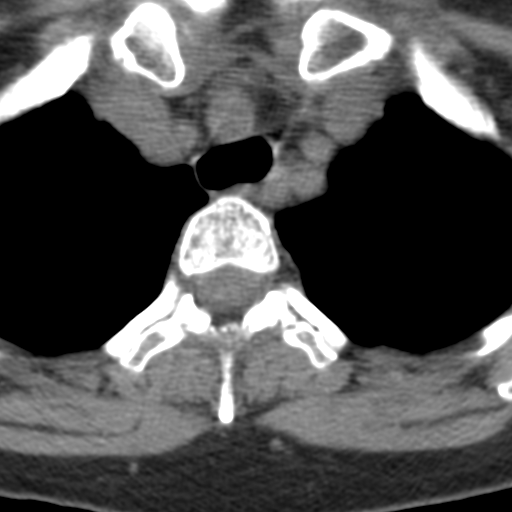
[im 16/93  bone]
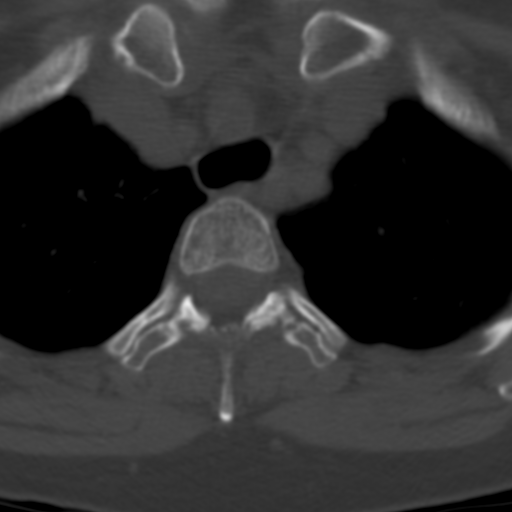
[im 31/93  bone]
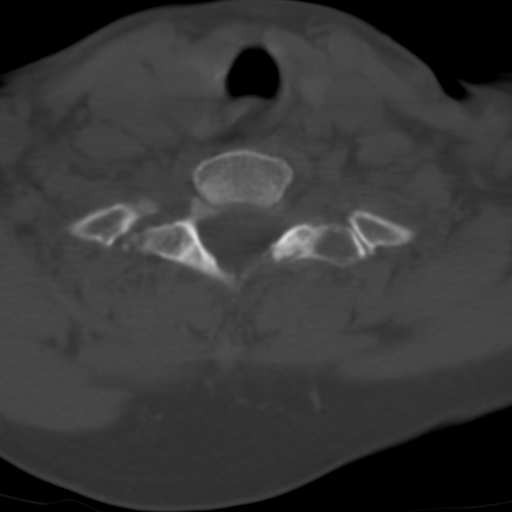
[im 47/93  bone]
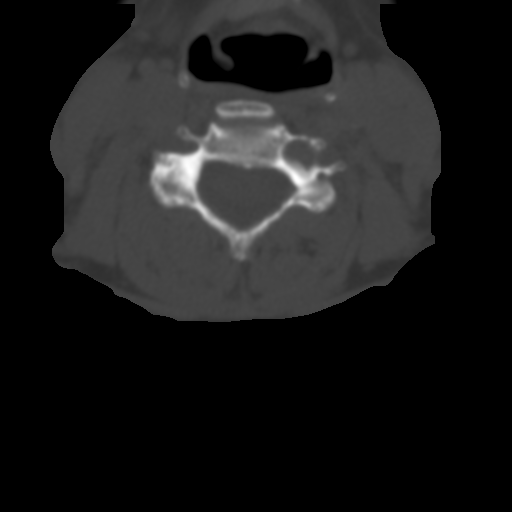
[im 62/93  bone]
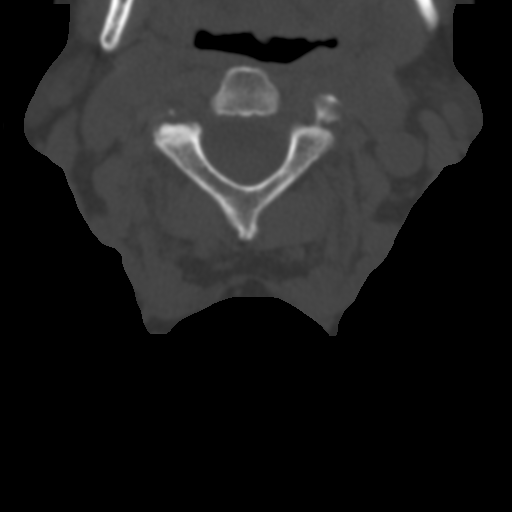
[im 77/93  soft-tissue]
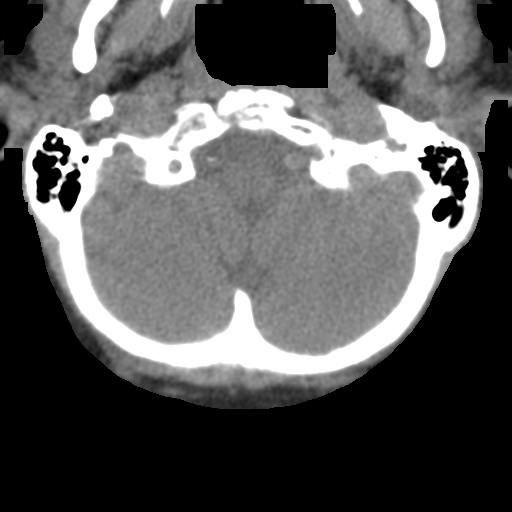
[im 77/93  bone]
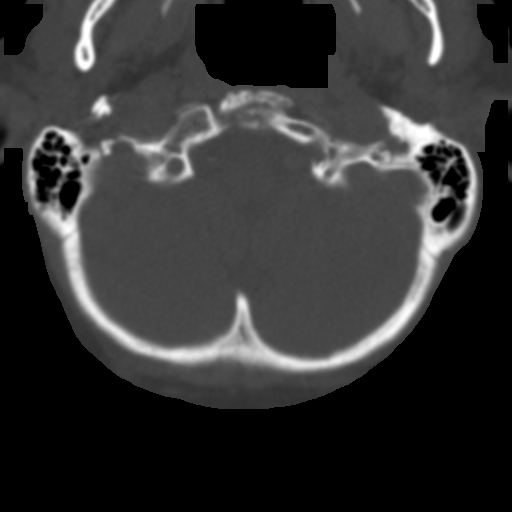

[Series 8: angled axial soft · axial · 0.29mm/px · z∈[+34,+155]mm · 5 of 94 slices shown]
[im 16/94  soft-tissue]
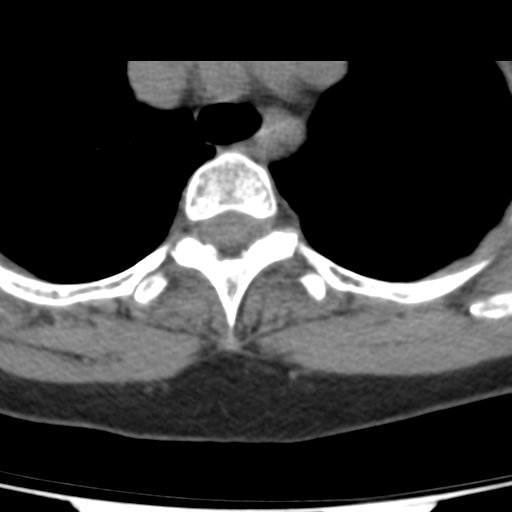
[im 32/94  soft-tissue]
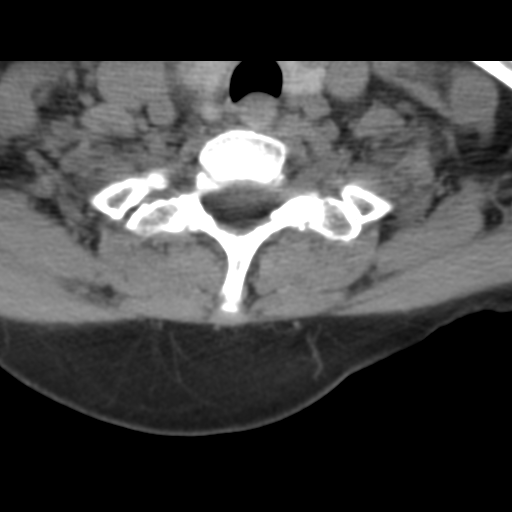
[im 47/94  soft-tissue]
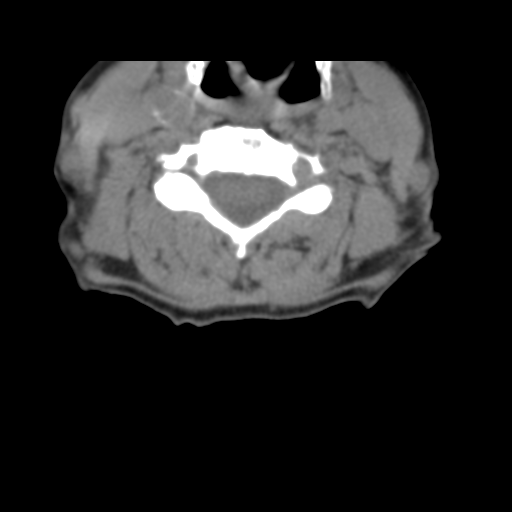
[im 63/94  soft-tissue]
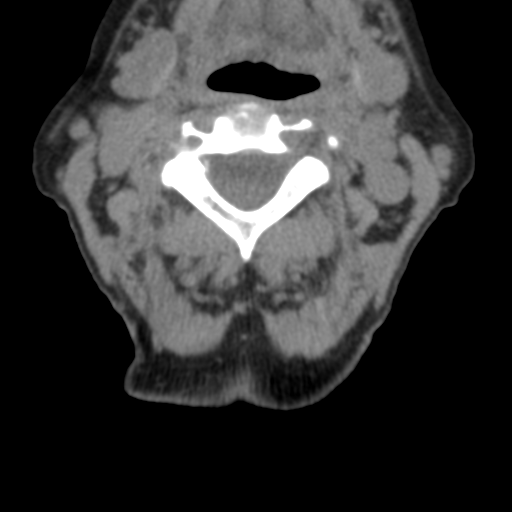
[im 78/94  soft-tissue]
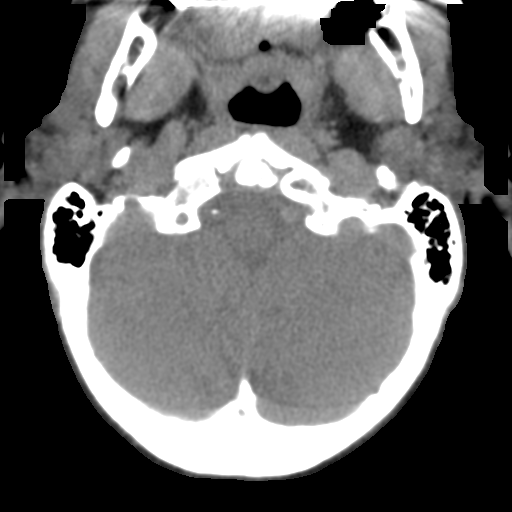

[10 of 14 positions shown; findings below may reference images not displayed]

FINDINGS: Alignment: Straightening of normal cervical lordosis. Trace
anterolisthesis noted C4 on 5, compatible with the facet
degeneration at this level.

Skull base and vertebrae: No acute fracture. No primary bone lesion
or focal pathologic process. Incomplete fusion anterior arch C1.

Soft tissues and spinal canal: No prevertebral fluid or swelling. No
visible canal hematoma.

Disc levels: Loss of disc height noted C3-4. Substantial loss of
disc height at C5-6 and C6-7 is associated with endplate
degeneration. Marked facet osteoarthritis noted on the left at C3-4
and C4-5.

Upper chest: Unremarkable

Other: None.
IMPRESSION: 1. No evidence for cervical spine fracture.
2. Loss of cervical lordosis. This can be related to patient
positioning, muscle spasm or soft tissue injury.
3. Degenerative disc disease and facet osteoarthritis.

## 2024-02-08 ENCOUNTER — Other Ambulatory Visit: Payer: Self-pay | Admitting: Surgical

## 2024-02-08 DIAGNOSIS — M542 Cervicalgia: Secondary | ICD-10-CM

## 2024-02-09 NOTE — Telephone Encounter (Signed)
 Patient not seen in like 2.5 years

## 2024-02-16 ENCOUNTER — Encounter: Payer: Self-pay | Admitting: Radiology
